# Patient Record
Sex: Male | Born: 1990
Health system: Southern US, Community
[De-identification: ages and names within clinical notes are randomized; demographics above are authoritative.]

## PROBLEM LIST (undated history)

## (undated) DIAGNOSIS — M1990 Unspecified osteoarthritis, unspecified site: Secondary | ICD-10-CM

## (undated) DIAGNOSIS — M2550 Pain in unspecified joint: Secondary | ICD-10-CM

## (undated) DIAGNOSIS — K760 Fatty (change of) liver, not elsewhere classified: Secondary | ICD-10-CM

## (undated) DIAGNOSIS — G5601 Carpal tunnel syndrome, right upper limb: Secondary | ICD-10-CM

## (undated) DIAGNOSIS — E559 Vitamin D deficiency, unspecified: Secondary | ICD-10-CM

## (undated) DIAGNOSIS — F419 Anxiety disorder, unspecified: Secondary | ICD-10-CM

## (undated) DIAGNOSIS — K219 Gastro-esophageal reflux disease without esophagitis: Secondary | ICD-10-CM

## (undated) DIAGNOSIS — N469 Male infertility, unspecified: Secondary | ICD-10-CM

## (undated) HISTORY — DX: Male infertility, unspecified: N46.9

## (undated) HISTORY — DX: Gastro-esophageal reflux disease without esophagitis: K21.9

## (undated) HISTORY — DX: Fatty (change of) liver, not elsewhere classified: K76.0

## (undated) HISTORY — DX: Vitamin D deficiency, unspecified: E55.9

## (undated) HISTORY — DX: Carpal tunnel syndrome, right upper limb: G56.01

## (undated) HISTORY — DX: Anxiety disorder, unspecified: F41.9

## (undated) HISTORY — DX: Unspecified osteoarthritis, unspecified site: M19.90

## (undated) HISTORY — DX: Pain in unspecified joint: M25.50

---

## 2013-03-29 ENCOUNTER — Encounter: Payer: Self-pay | Admitting: Sports Medicine

## 2013-03-29 ENCOUNTER — Ambulatory Visit (INDEPENDENT_AMBULATORY_CARE_PROVIDER_SITE_OTHER): Payer: BC Managed Care – PPO | Admitting: Sports Medicine

## 2013-03-29 VITALS — BP 130/82 | HR 74 | Wt 215.0 lb

## 2013-03-29 DIAGNOSIS — H6123 Impacted cerumen, bilateral: Secondary | ICD-10-CM

## 2013-03-29 DIAGNOSIS — M5412 Radiculopathy, cervical region: Secondary | ICD-10-CM

## 2013-03-29 DIAGNOSIS — Z0000 Encounter for general adult medical examination without abnormal findings: Secondary | ICD-10-CM | POA: Insufficient documentation

## 2013-03-29 DIAGNOSIS — Z299 Encounter for prophylactic measures, unspecified: Secondary | ICD-10-CM

## 2013-03-29 DIAGNOSIS — G5601 Carpal tunnel syndrome, right upper limb: Secondary | ICD-10-CM | POA: Insufficient documentation

## 2013-03-29 DIAGNOSIS — H6120 Impacted cerumen, unspecified ear: Secondary | ICD-10-CM

## 2013-03-29 HISTORY — DX: Carpal tunnel syndrome, right upper limb: G56.01

## 2013-03-29 MED ORDER — CYCLOBENZAPRINE HCL 10 MG PO TABS: ORAL_TABLET | ORAL | Status: DC

## 2013-03-29 MED ORDER — MELOXICAM 15 MG PO TABS: ORAL_TABLET | ORAL | Status: DC

## 2013-03-29 MED ORDER — PREDNISONE 50 MG PO TABS: ORAL_TABLET | ORAL | Status: DC

## 2013-03-29 NOTE — Assessment & Plan Note (Signed)
Irrigation bilateral.
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
## 2013-03-29 NOTE — Assessment & Plan Note (Signed)
Checking some routine fasting blood work.
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
## 2013-05-04 ENCOUNTER — Ambulatory Visit (INDEPENDENT_AMBULATORY_CARE_PROVIDER_SITE_OTHER): Payer: BC Managed Care – PPO | Admitting: Sports Medicine

## 2013-05-04 ENCOUNTER — Telehealth: Payer: Self-pay | Admitting: *Deleted

## 2013-05-04 VITALS — BP 124/72 | HR 49 | Wt 216.0 lb

## 2013-05-04 DIAGNOSIS — M5412 Radiculopathy, cervical region: Secondary | ICD-10-CM

## 2013-05-04 LAB — COMPREHENSIVE METABOLIC PANEL WITH GFR
ALT: 61 U/L — ABNORMAL HIGH (ref 0–53)
Albumin: 4.5 g/dL (ref 3.5–5.2)
CO2: 26 meq/L (ref 19–32)
Calcium: 9.8 mg/dL (ref 8.4–10.5)
Chloride: 104 meq/L (ref 96–112)
Glucose, Bld: 89 mg/dL (ref 70–99)
Potassium: 4.3 meq/L (ref 3.5–5.3)
Sodium: 138 meq/L (ref 135–145)
Total Bilirubin: 0.6 mg/dL (ref 0.3–1.2)
Total Protein: 7.1 g/dL (ref 6.0–8.3)

## 2013-05-04 LAB — CBC
HCT: 44 % (ref 39.0–52.0)
Hemoglobin: 15.1 g/dL (ref 13.0–17.0)
MCH: 31.3 pg (ref 26.0–34.0)
MCHC: 34.3 g/dL (ref 30.0–36.0)
MCV: 91.3 fL (ref 78.0–100.0)
Platelets: 348 K/uL (ref 150–400)
RBC: 4.82 MIL/uL (ref 4.22–5.81)
RDW: 13.1 % (ref 11.5–15.5)
WBC: 7.4 K/uL (ref 4.0–10.5)

## 2013-05-04 LAB — LIPID PANEL
Cholesterol: 143 mg/dL (ref 0–200)
HDL: 40 mg/dL (ref >39)
LDL Cholesterol: 82 mg/dL (ref 0–99)
Total CHOL/HDL Ratio: 3.6 Ratio
Triglycerides: 103 mg/dL (ref <150)
VLDL: 21 mg/dL (ref 0–40)

## 2013-05-04 LAB — COMPREHENSIVE METABOLIC PANEL
AST: 28 U/L (ref 0–37)
Alkaline Phosphatase: 72 U/L (ref 39–117)
BUN: 16 mg/dL (ref 6–23)
Creat: 0.85 mg/dL (ref 0.50–1.35)

## 2013-05-04 LAB — TSH: TSH: 1.177 u[IU]/mL (ref 0.350–4.500)

## 2013-05-04 NOTE — Assessment & Plan Note (Signed)
Persistent right-sided radiculopathy despite prednisone, Flexeril, Mobic, he was unable to do the home rehabilitation exercises, and declines formal physical therapy. At this point we are going to proceed with an MRI of the cervical spine for interventional injection planning. Return to see me to go over the results of the MRI.

## 2013-05-04 NOTE — Telephone Encounter (Signed)
PA obtained for MRI cervical spine w/o contrast. Myriam Jacobson at [facility] notified. PA # is [id number]

## 2013-05-04 NOTE — Progress Notes (Signed)
  Subjective:    CC: Followup  HPI: Right cervical radiculopathy:  This actually improved with prednisone, Flexeril, Mobic, he was able to sleep better. Currently he has no pain, but he still has numbness in his right hand. His neck occasionally locks, and when he does that is painful, the pain is mild, persistent. He is overall only slightly better since the last visit. He was unable to do any physical therapy.  Past medical history, Surgical history, Family history not pertinant except as noted below, Social history, Allergies, and medications have been entered into the medical record, reviewed, and no changes needed.   Review of Systems: No fevers, chills, night sweats, weight loss, chest pain, or shortness of breath.   Objective:    General: Well Developed, well nourished, and in no acute distress.  Neuro: Alert and oriented x3, extra-ocular muscles intact, sensation grossly intact.  HEENT: Normocephalic, atraumatic, pupils equal round reactive to light, neck supple, no masses, no lymphadenopathy, thyroid nonpalpable.  Skin: Warm and dry, no rashes. Cardiac: Regular rate and rhythm, no murmurs rubs or gallops, no lower extremity edema.  Respiratory: Clear to auscultation bilaterally. Not using accessory muscles, speaking in full sentences. Neck: Inspection unremarkable. No palpable stepoffs. Negative Spurling's maneuver. Full neck range of motion Grip strength and sensation normal in bilateral hands Strength good C4 to T1 distribution No sensory change to C4 to T1 Negative Hoffman sign bilaterally Reflexes normal Impression and Recommendations:

## 2013-05-05 ENCOUNTER — Other Ambulatory Visit: Payer: Self-pay | Admitting: Sports Medicine

## 2013-05-05 DIAGNOSIS — M5412 Radiculopathy, cervical region: Secondary | ICD-10-CM

## 2013-05-05 LAB — VITAMIN D 25 HYDROXY (VIT D DEFICIENCY, FRACTURES): Vit D, 25-Hydroxy: 44 ng/mL (ref 30–89)

## 2013-05-08 ENCOUNTER — Ambulatory Visit (HOSPITAL_BASED_OUTPATIENT_CLINIC_OR_DEPARTMENT_OTHER)
Admission: RE | Admit: 2013-05-08 | Discharge: 2013-05-08 | Disposition: A | Discharge status: Home / Self Care | Payer: BC Managed Care – PPO | Source: Ambulatory Visit | Attending: Sports Medicine | Admitting: Sports Medicine

## 2013-05-08 DIAGNOSIS — M5412 Radiculopathy, cervical region: Secondary | ICD-10-CM

## 2013-05-08 DIAGNOSIS — M542 Cervicalgia: Secondary | ICD-10-CM | POA: Insufficient documentation

## 2013-06-16 ENCOUNTER — Encounter: Payer: Self-pay | Admitting: Sports Medicine

## 2013-06-16 ENCOUNTER — Ambulatory Visit (INDEPENDENT_AMBULATORY_CARE_PROVIDER_SITE_OTHER): Payer: BC Managed Care – PPO | Admitting: Sports Medicine

## 2013-06-16 ENCOUNTER — Ambulatory Visit (INDEPENDENT_AMBULATORY_CARE_PROVIDER_SITE_OTHER): Payer: BC Managed Care – PPO

## 2013-06-16 VITALS — BP 126/76 | HR 74 | Wt 232.0 lb

## 2013-06-16 DIAGNOSIS — M222X2 Patellofemoral disorders, left knee: Secondary | ICD-10-CM | POA: Insufficient documentation

## 2013-06-16 DIAGNOSIS — M5412 Radiculopathy, cervical region: Secondary | ICD-10-CM

## 2013-06-16 DIAGNOSIS — M25569 Pain in unspecified knee: Secondary | ICD-10-CM

## 2013-06-16 MED ORDER — CYCLOBENZAPRINE HCL 10 MG PO TABS: ORAL_TABLET | ORAL | Status: DC

## 2013-06-16 MED ORDER — PREDNISONE (PAK) 10 MG PO TABS: ORAL_TABLET | ORAL | Status: DC

## 2013-06-16 NOTE — Progress Notes (Signed)
  Subjective:    CC: Followup  HPI: Neck pain and right arm numbness: Has been under some home exercises, steroids, Mobic, Flexeril, steroids and Flexeril helped significantly, but fortunately he had insufficient improvement and we obtained an MRI, the results of which will be dictated below. 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  Left knee pain: Larey Seat directly onto the knee approximately 7 years ago, had immediate pain, swelling but this was never evaluated by a physician. Since then his pain he localizes over the kneecap, associated with grinding, worse with going up and down stairs and sitting in a squatting position. It is moderate, persistent, local ice.  Past medical history, Surgical history, Family history not pertinant except as noted below, Social history, Allergies, and medications have been entered into the medical record, reviewed, and no changes needed.   Review of Systems: No fevers, chills, night sweats, weight loss, chest pain, or shortness of breath.   Objective:    General: Well Developed, well nourished, and in no acute distress.  Neuro: Alert and oriented x3, extra-ocular muscles intact, sensation grossly intact.  HEENT: Normocephalic, atraumatic, pupils equal round reactive to light, neck supple, no masses, no lymphadenopathy, thyroid nonpalpable.  Skin: Warm and dry, no rashes. Cardiac: Regular rate and rhythm, no murmurs rubs or gallops, no lower extremity edema.  Respiratory: Clear to auscultation bilaterally. Not using accessory muscles, speaking in full sentences. Neck: Inspection unremarkable. No palpable stepoffs. Negative Spurling's maneuver. Full neck range of motion Grip strength and sensation normal in bilateral hands Strength good C4 to T1 distribution No sensory change to C4 to T1 Negative Hoffman sign bilaterally Reflexes normal Left Knee: Normal to inspection with no erythema or  effusion or obvious bony abnormalities. Palpation normal with no warmth, joint line tenderness, patellar tenderness, or condyle tenderness. ROM full in flexion and extension and lower leg rotation. Ligaments with solid consistent endpoints including ACL, PCL, LCL, MCL. Negative Mcmurray's, Apley's, and Thessalonian tests. Painful patellar compression with crepitus. Patellar and quadriceps tendons unremarkable. Hamstring and quadriceps strength is normal.   X-rays of the knees were reviewed and were unremarkable.  Cervical spine MRI was reviewed, there is some reversal of the normal cervical lordosis, otherwise the discs, facet joints, spinal canal, and foramina were unremarkable.  Impression and Recommendations:

## 2013-06-16 NOTE — Assessment & Plan Note (Signed)
Pain is persistent with numbness in the right hand in a clinical C7 distribution but with a negative cervical spine MRI. He did have a good response to Flexeril and continues to have abnormal loss of cervical lordosis. At this point I'm going to give him an additional course of prednisone, 12 day taper. Refill Flexeril. He will do more home rehabilitation before we consider nerve conduction study. I would like to see him back in one month for this.

## 2013-06-16 NOTE — Assessment & Plan Note (Signed)
We will start conservatively with Mobic, home exercises. X-rays. Return in 4 weeks, consider injection if no better.

## 2013-07-15 ENCOUNTER — Ambulatory Visit: Payer: BC Managed Care – PPO | Admitting: Sports Medicine

## 2013-10-31 IMAGING — CR DG KNEE COMPLETE 4+V*L*
1 series · 1 of 1 positions shown · non-contrast
Comparison: None.

CLINICAL DATA: Knee pain

EXAM:
LEFT KNEE - COMPLETE 4+ VIEW

[view not recorded]
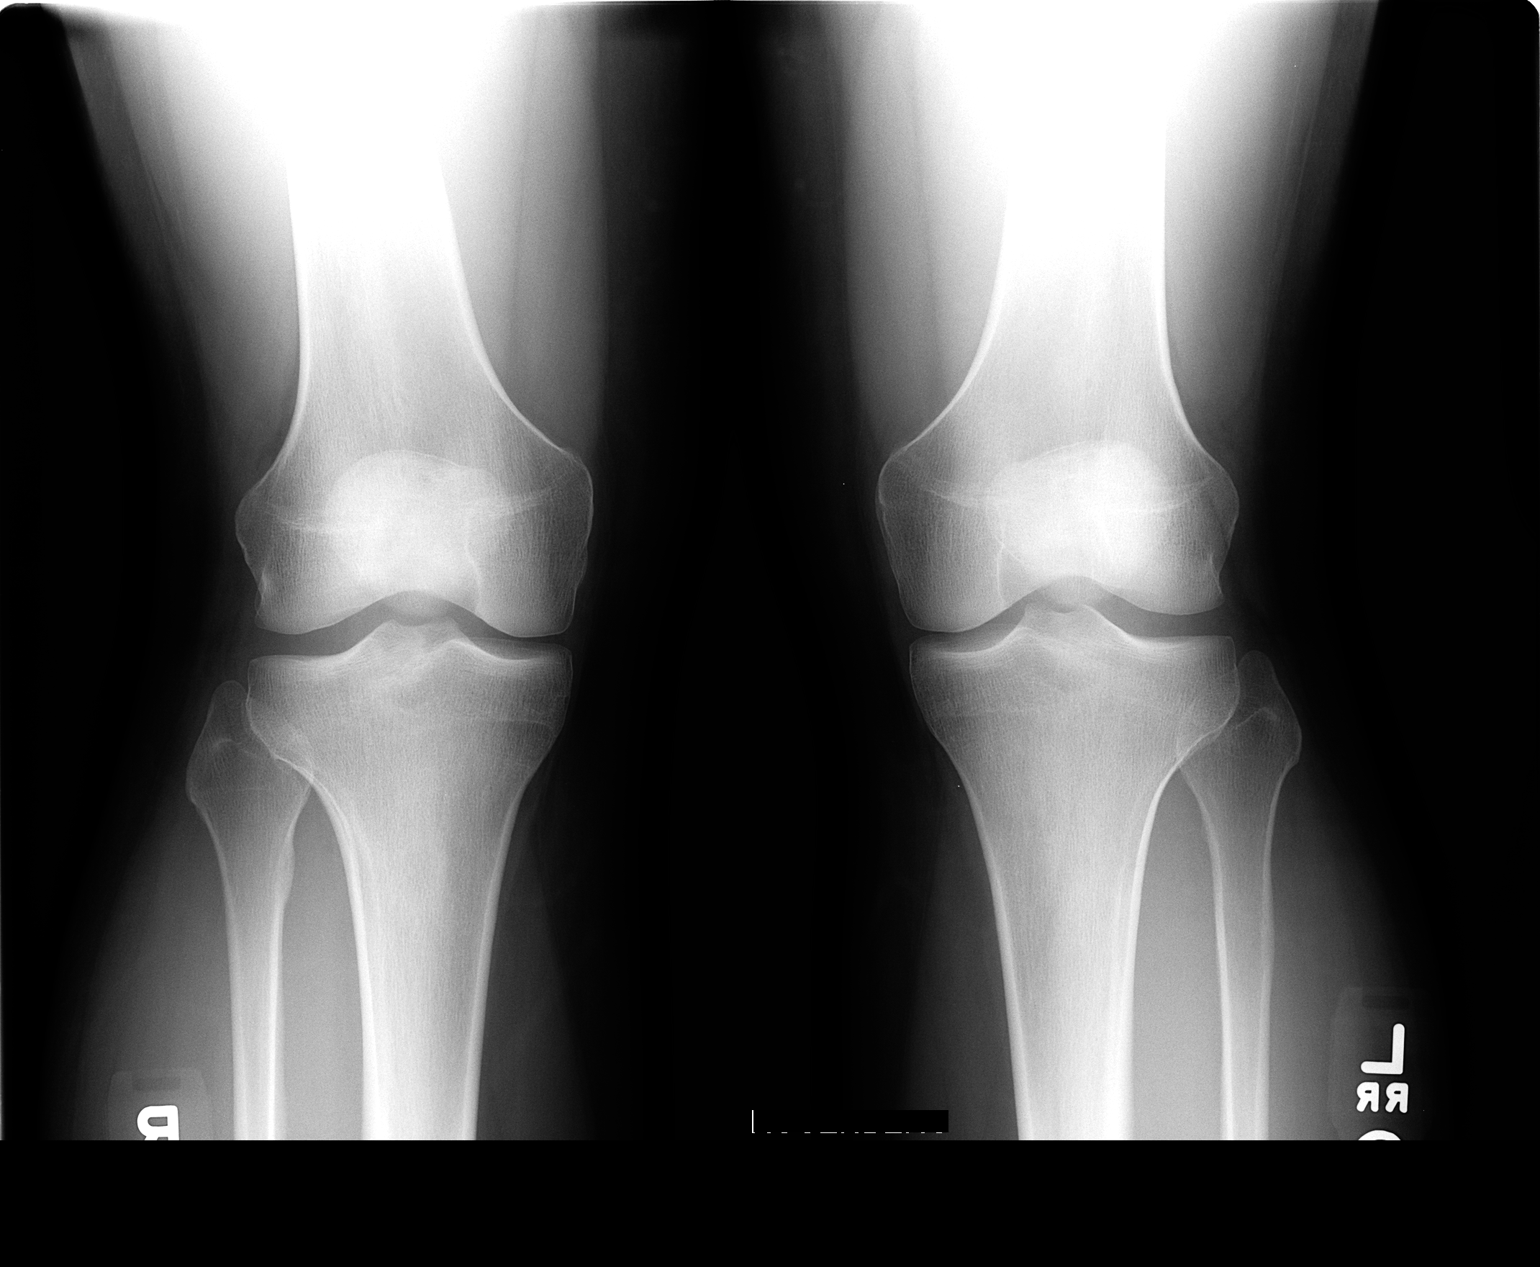

[1 of 1 positions shown; findings below may reference images not displayed]

FINDINGS: Standing views of both knees were obtained. The knee joint spaces
appear well preserved. No left knee joint effusion is seen. The left
patella is normally positioned within the patella femoral space.
IMPRESSION: Negative.

## 2013-12-29 ENCOUNTER — Encounter: Payer: Self-pay | Admitting: Sports Medicine

## 2013-12-29 ENCOUNTER — Ambulatory Visit (INDEPENDENT_AMBULATORY_CARE_PROVIDER_SITE_OTHER): Payer: BC Managed Care – PPO | Admitting: Sports Medicine

## 2013-12-29 VITALS — BP 127/83 | HR 55 | Ht 68.0 in | Wt 216.0 lb

## 2013-12-29 DIAGNOSIS — M5412 Radiculopathy, cervical region: Secondary | ICD-10-CM

## 2013-12-29 MED ORDER — TRAMADOL HCL 50 MG PO TABS: ORAL_TABLET | ORAL | Status: DC

## 2013-12-29 MED ORDER — MELOXICAM 15 MG PO TABS: ORAL_TABLET | ORAL | Status: DC

## 2013-12-29 NOTE — Progress Notes (Signed)
Patient ID: Adam Adams, male   DOB: 1991-05-19, 23 y.o.   MRN: [id number]  Subjective:    CC: continued neck pain and finger numbness  HPI:  Pt has an ongoing history of neck pain with numbness of the right 3rd and 4th fingers.  Most recently he was treated with prednisone and mobic with only minimal response.  He also experienced sleeplessness and agitation while taking the prednisone so he discontinued its use.  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  He fell down and was momentarily dizzy afterwards.  Since he has had worsened neck pain but no signs concerning for concussion.  His neck pain is still focal over his c-spine with no radiation down the arm.  His numbness is only present in the tips of his right 3rd and 4th fingers.    Past medical history, Surgical history, Family history not pertinant except as noted below, Social history, Allergies, and medications have been entered into the medical record, reviewed, and no changes needed.   Review of Systems: No fevers, chills, night sweats, weight loss, chest pain, or shortness of breath.   Objective:    General: Well Developed, well nourished, and in no acute distress.  Neuro: Alert and oriented x3, extra-ocular muscles intact, sensation grossly intact.  HEENT: Normocephalic, atraumatic, pupils equal round reactive to light, neck supple, no masses, no lymphadenopathy, thyroid nonpalpable.  Skin: Warm and dry, no rashes. Cardiac: Regular rate and rhythm, no murmurs rubs or gallops, no lower extremity edema.  Respiratory: Clear to auscultation bilaterally. Not using accessory muscles, speaking in full sentences. Neck: Inspection unremarkable. No palpable stepoffs. Negative Spurling's maneuver. Full neck range of motion Grip strength and sensation normal in bilateral hands Strength good C4 to T1 distribution Parasthesias in C7 distribution distally  Negative Hoffman  sign bilaterally Reflexes normal Right Wrist: Inspection normal with no visible erythema or swelling. ROM smooth and normal with good flexion and extension and ulnar/radial deviation that is symmetrical with opposite wrist. Palpation is normal over metacarpals, navicular, lunate, and TFCC; tendons without tenderness/ swelling No snuffbox tenderness. No tenderness over Canal of Guyon. Strength 5/5 in all directions without pain. Negative Finkelstein, tinel's and phalens. Negative Watson's test.  Impression and Recommendations:    This patient has evidence of cervical radiculopathy with distal C7 numbness and axial neck pain.  Recent MRI also showed evidence of multilevel DJD but no discs suspicious for his symptoms.  Given lack of response to conservative treatment and lack of definitive etiology of radicular symptoms, further evaluation with nerve conduction studies is warranted.   -Nerve conduction studies -Refill Mobic -Add tramadol

## 2013-12-29 NOTE — Assessment & Plan Note (Signed)
With numbness and tingling into the right third and fourth fingers, at this point I am unclear whether this is related to carpal tunnel syndrome versus cervical radiculopathy. We are going to obtain nerve conduction studies to further elucidate the site of compression. He does have multilevel mild degenerative changes in the C-spine with a congenitally narrow cervical spinal canal. I'm going to refill his NSAIDs, and add tramadol for breakthrough pain. Return to see me for the results of the nerve conduction study.

## 2014-01-04 ENCOUNTER — Ambulatory Visit: Payer: BC Managed Care – PPO | Admitting: Sports Medicine

## 2014-01-05 ENCOUNTER — Ambulatory Visit: Payer: BC Managed Care – PPO | Admitting: Sports Medicine

## 2014-01-06 ENCOUNTER — Encounter (INDEPENDENT_AMBULATORY_CARE_PROVIDER_SITE_OTHER): Payer: Self-pay | Admitting: Neurology

## 2014-01-06 ENCOUNTER — Ambulatory Visit (INDEPENDENT_AMBULATORY_CARE_PROVIDER_SITE_OTHER): Payer: BC Managed Care – HMO

## 2014-01-06 ENCOUNTER — Encounter: Payer: Self-pay | Admitting: Sports Medicine

## 2014-01-06 DIAGNOSIS — R209 Unspecified disturbances of skin sensation: Secondary | ICD-10-CM

## 2014-01-06 DIAGNOSIS — G5600 Carpal tunnel syndrome, unspecified upper limb: Secondary | ICD-10-CM

## 2014-01-06 DIAGNOSIS — Z0289 Encounter for other administrative examinations: Secondary | ICD-10-CM

## 2014-01-06 NOTE — Procedures (Signed)
     HISTORY:  Adam Adams is a 23 year old Hispanic male with a one-year history of right hand numbness and fatigue affecting mainly the ulnar aspect of the right hand. He also reports neck discomfort and pain down the right arm. He is being evaluated for possible neuropathy or a cervical radiculopathy.  NERVE CONDUCTION STUDIES:  Nerve conduction studies were performed on both upper extremities. The distal motor latencies for the median nerves were prolonged on the right, normal on the left, with normal motor amplitudes for these nerves bilaterally. The F wave latencies and nerve conduction velocities for the median nerves were normal bilaterally. The sensory latencies for the median nerves were prolonged on the right, normal on the left, and normal for the ulnar nerves bilaterally. The distal motor latencies and motor amplitudes for the ulnar nerves were normal, with normal F wave latencies and nerve conduction velocities bilaterally.  EMG STUDIES:  EMG study was performed on the right upper extremity:  The first dorsal interosseous muscle reveals 2 to 4 K units with full recruitment. No fibrillations or positive waves were noted. The abductor pollicis brevis muscle reveals 2 to 4 K units with full recruitment. No fibrillations or positive waves were noted. The extensor indicis proprius muscle reveals 1 to 3 K units with full recruitment. No fibrillations or positive waves were noted. The pronator teres muscle reveals 2 to 3 K units with full recruitment. No fibrillations or positive waves were noted. The biceps muscle reveals 1 to 2 K units with full recruitment. No fibrillations or positive waves were noted. The triceps muscle reveals 2 to 4 K units with full recruitment. No fibrillations or positive waves were noted. The anterior deltoid muscle reveals 2 to 3 K units with full recruitment. No fibrillations or positive waves were noted. The cervical paraspinal muscles were tested at 2  levels. No abnormalities of insertional activity were seen at either level tested. There was good relaxation.   IMPRESSION:  Nerve conduction studies done on both upper extremities shows evidence of a mild right carpal tunnel syndrome. EMG evaluation of the right upper extremity was unremarkable, without evidence of an overlying cervical radiculopathy.  Adam Adams. Adam Annahi Short MD 01/06/2014 12:35 PM  [facility] [address] [place], [place] [id number]  Phone [phone] Fax [phone]

## 2014-05-31 ENCOUNTER — Ambulatory Visit (INDEPENDENT_AMBULATORY_CARE_PROVIDER_SITE_OTHER): Payer: BC Managed Care – PPO | Admitting: Sports Medicine

## 2014-05-31 ENCOUNTER — Encounter: Payer: Self-pay | Admitting: Sports Medicine

## 2014-05-31 ENCOUNTER — Ambulatory Visit: Payer: BC Managed Care – PPO | Admitting: Sports Medicine

## 2014-05-31 VITALS — BP 127/82 | HR 68 | Ht 68.0 in | Wt 223.0 lb

## 2014-05-31 DIAGNOSIS — R740 Nonspecific elevation of levels of transaminase and lactic acid dehydrogenase [LDH]: Secondary | ICD-10-CM

## 2014-05-31 DIAGNOSIS — R202 Paresthesia of skin: Principal | ICD-10-CM

## 2014-05-31 DIAGNOSIS — R200 Anesthesia of skin: Secondary | ICD-10-CM

## 2014-05-31 DIAGNOSIS — R7401 Elevation of levels of liver transaminase levels: Secondary | ICD-10-CM

## 2014-05-31 DIAGNOSIS — Z299 Encounter for prophylactic measures, unspecified: Secondary | ICD-10-CM

## 2014-05-31 DIAGNOSIS — G5600 Carpal tunnel syndrome, unspecified upper limb: Secondary | ICD-10-CM | POA: Diagnosis not present

## 2014-05-31 NOTE — Assessment & Plan Note (Signed)
Median nerve hydrodissection as above. Daily splinting. Return in one month. Nerve conduction study did confirm right-sided carpal tunnel syndrome without evidence of cervical radiculitis.

## 2014-05-31 NOTE — Assessment & Plan Note (Signed)
Checking routine blood work, patient will return fasting in the morning.

## 2014-05-31 NOTE — Progress Notes (Signed)
  Subjective:    CC: Followup  HPI: This is a very pleasant 23 year old male, he has been having some right hand numbness and tingling, initially diagnosed in with possible cervical radicular symptoms but he did not improve with conservative measures. Subsequently there was concern concerned as to whether this represented a carpal tunnel syndrome so we obtain nerve conduction study. This was approximately 4 months ago. Nerve conduction studies did show right-sided carpal tunnel syndrome with no evidence of cervical radiculopathy. He is here for further evaluation treatment, symptoms are moderate, persistent.  Past medical history, Surgical history, Family history not pertinant except as noted below, Social history, Allergies, and medications have been entered into the medical record, reviewed, and no changes needed.   Review of Systems: No fevers, chills, night sweats, weight loss, chest pain, or shortness of breath.   Objective:    General: Well Developed, well nourished, and in no acute distress.  Neuro: Alert and oriented x3, extra-ocular muscles intact, sensation grossly intact.  HEENT: Normocephalic, atraumatic, pupils equal round reactive to light, neck supple, no masses, no lymphadenopathy, thyroid nonpalpable.  Skin: Warm and dry, no rashes. Cardiac: Regular rate and rhythm, no murmurs rubs or gallops, no lower extremity edema.  Respiratory: Clear to auscultation bilaterally. Not using accessory muscles, speaking in full sentences.  Procedure: Real-time Ultrasound Guided hydrodissection of right median nerve Device: GE Logiq E  Verbal informed consent obtained.  Time-out conducted.  Noted no overlying erythema, induration, or other signs of local infection.  Skin prepped in a sterile fashion.  Local anesthesia: Topical Ethyl chloride.  With sterile technique and under real time ultrasound guidance: A total of 1 cc kenalog 40, 4 cc lidocaine injected both superficial to and deep to  the median nerve in the carpal tunnel free from surrounding structures.  Completed without difficulty  Pain immediately resolved suggesting accurate placement of the medication.  Advised to call if fevers/chills, erythema, induration, drainage, or persistent bleeding.  Images permanently stored and available for review in the ultrasound unit.  Impression: Technically successful ultrasound guided injection.  Impression and Recommendations:

## 2014-06-01 LAB — CBC
HCT: 43.4 % (ref 39.0–52.0)
Hemoglobin: 15.5 g/dL (ref 13.0–17.0)
MCH: 31.7 pg (ref 26.0–34.0)
MCHC: 35.7 g/dL (ref 30.0–36.0)
MCV: 88.8 fL (ref 78.0–100.0)
Platelets: 339 10*3/uL (ref 150–400)
RBC: 4.89 MIL/uL (ref 4.22–5.81)
RDW: 13.8 % (ref 11.5–15.5)
WBC: 10.2 10*3/uL (ref 4.0–10.5)

## 2014-06-01 LAB — COMPREHENSIVE METABOLIC PANEL WITH GFR
ALT: 145 U/L — ABNORMAL HIGH (ref 0–53)
CO2: 23 meq/L (ref 19–32)
Calcium: 9.6 mg/dL (ref 8.4–10.5)
Glucose, Bld: 87 mg/dL (ref 70–99)
Potassium: 4.2 meq/L (ref 3.5–5.3)
Sodium: 139 meq/L (ref 135–145)
Total Bilirubin: 0.7 mg/dL (ref 0.2–1.2)

## 2014-06-01 LAB — COMPREHENSIVE METABOLIC PANEL
AST: 50 U/L — ABNORMAL HIGH (ref 0–37)
Albumin: 4.5 g/dL (ref 3.5–5.2)
Alkaline Phosphatase: 61 U/L (ref 39–117)
BUN: 13 mg/dL (ref 6–23)
Chloride: 104 mEq/L (ref 96–112)
Creat: 0.75 mg/dL (ref 0.50–1.35)
Total Protein: 7.3 g/dL (ref 6.0–8.3)

## 2014-06-01 LAB — LIPID PANEL
Cholesterol: 164 mg/dL (ref 0–200)
HDL: 53 mg/dL (ref >39)
LDL Cholesterol: 95 mg/dL (ref 0–99)
Total CHOL/HDL Ratio: 3.1 Ratio
Triglycerides: 80 mg/dL (ref <150)
VLDL: 16 mg/dL (ref 0–40)

## 2014-06-01 LAB — TSH: TSH: 1 u[IU]/mL (ref 0.350–4.500)

## 2014-06-02 DIAGNOSIS — R7401 Elevation of levels of liver transaminase levels: Secondary | ICD-10-CM | POA: Insufficient documentation

## 2014-06-02 DIAGNOSIS — R740 Nonspecific elevation of levels of transaminase and lactic acid dehydrogenase [LDH]: Secondary | ICD-10-CM

## 2014-06-02 LAB — HEMOGLOBIN A1C
Hgb A1c MFr Bld: 5.8 % — ABNORMAL HIGH (ref <5.7)
Mean Plasma Glucose: 120 mg/dL — ABNORMAL HIGH (ref <117)

## 2014-06-02 LAB — VITAMIN D 25 HYDROXY (VIT D DEFICIENCY, FRACTURES): Vit D, 25-Hydroxy: 28 ng/mL — ABNORMAL LOW (ref 30–89)

## 2014-06-02 NOTE — Addendum Note (Signed)
Addended by: Monica BectonHEKKEKANDAM, THOMAS J on: 06/02/2014 09:16 AM   Modules accepted: Orders

## 2015-03-07 ENCOUNTER — Encounter: Payer: Self-pay | Admitting: Sports Medicine

## 2015-03-07 ENCOUNTER — Ambulatory Visit (INDEPENDENT_AMBULATORY_CARE_PROVIDER_SITE_OTHER): Payer: BLUE CROSS/BLUE SHIELD | Admitting: Sports Medicine

## 2015-03-07 VITALS — BP 118/71 | HR 81 | Wt 222.0 lb

## 2015-03-07 DIAGNOSIS — G5601 Carpal tunnel syndrome, right upper limb: Secondary | ICD-10-CM

## 2015-03-07 DIAGNOSIS — L03115 Cellulitis of right lower limb: Secondary | ICD-10-CM | POA: Insufficient documentation

## 2015-03-07 DIAGNOSIS — M67441 Ganglion, right hand: Secondary | ICD-10-CM | POA: Diagnosis not present

## 2015-03-07 MED ORDER — DOXYCYCLINE HYCLATE 100 MG PO TABS: 100.0000 mg | ORAL_TABLET | Freq: Two times a day (BID) | ORAL | Status: AC

## 2015-03-07 NOTE — Progress Notes (Addendum)
  Subjective:    CC: Follow-up and knot on finger  HPI: This is a pleasant 24 year old male, he is a Curatormechanic, over the past several weeks he's noted a minimally painful lump at the right third metacarpophalangeal joint from a volar aspect, no trauma. No triggering of the finger, pain is mild, persistent.  Right carpal tunnel syndrome: Nerve conduction study confirmed, he did extremely well with a median nerve hydrodissection, with a six-month response.  Past medical history, Surgical history, Family history not pertinant except as noted below, Social history, Allergies, and medications have been entered into the medical record, reviewed, and no changes needed.   Review of Systems: No fevers, chills, night sweats, weight loss, chest pain, or shortness of breath.   Objective:    General: Well Developed, well nourished, and in no acute distress.  Neuro: Alert and oriented x3, extra-ocular muscles intact, sensation grossly intact.  HEENT: Normocephalic, atraumatic, pupils equal round reactive to light, neck supple, no masses, no lymphadenopathy, thyroid nonpalpable.  Skin: Warm and dry, no rashes. Cardiac: Regular rate and rhythm, no murmurs rubs or gallops, no lower extremity edema.  Respiratory: Clear to auscultation bilaterally. Not using accessory muscles, speaking in full sentences. Right hand: There is a 1 cm palpable, well-defined, movable nodule at the right third metacarpophalangeal joint, volar aspect, it does not move with movement of the digit. It is minimally tender.  Procedure: Real-time Ultrasound Guided Injection of right third flexor sheath ganglion cyst Device: GE Logiq E  Verbal informed consent obtained.  Time-out conducted.  Noted no overlying erythema, induration, or other signs of local infection.  Skin prepped in a sterile fashion.  Local anesthesia: Topical Ethyl chloride.  With sterile technique and under real time ultrasound guidance:  Injected 0.5 mL kenalog 40,  0.5 mL lidocaine, and a fenestrated ganglion cyst multiple times, it did collapse completely. Completed without difficulty  Pain immediately resolved suggesting accurate placement of the medication.  Advised to call if fevers/chills, erythema, induration, drainage, or persistent bleeding.  Images permanently stored and available for review in the ultrasound unit.  Impression: Technically successful ultrasound guided injection.  Impression and Recommendations:    I spent 40 minutes with this patient, greater than 50% was face-to-face time counseling regarding the above diagnoses

## 2015-03-07 NOTE — Assessment & Plan Note (Signed)
Percutaneous fenestration and injection as above. Return in one month.

## 2015-03-07 NOTE — Assessment & Plan Note (Signed)
6 month response to median nerve hydrodissection of the right side. Patient was very happy with results and will return for a repeat.

## 2015-03-30 ENCOUNTER — Encounter: Payer: Self-pay | Admitting: Sports Medicine

## 2015-03-30 ENCOUNTER — Ambulatory Visit (INDEPENDENT_AMBULATORY_CARE_PROVIDER_SITE_OTHER): Payer: BLUE CROSS/BLUE SHIELD | Admitting: Sports Medicine

## 2015-03-30 VITALS — BP 123/88 | HR 62 | Temp 98.4°F | Wt 220.0 lb

## 2015-03-30 DIAGNOSIS — N469 Male infertility, unspecified: Secondary | ICD-10-CM

## 2015-03-30 DIAGNOSIS — M67441 Ganglion, right hand: Secondary | ICD-10-CM | POA: Diagnosis not present

## 2015-03-30 DIAGNOSIS — G5601 Carpal tunnel syndrome, right upper limb: Secondary | ICD-10-CM

## 2015-03-30 DIAGNOSIS — Z3009 Encounter for other general counseling and advice on contraception: Secondary | ICD-10-CM

## 2015-03-30 HISTORY — DX: Male infertility, unspecified: N46.9

## 2015-03-30 NOTE — Assessment & Plan Note (Signed)
Has been using the rhythm method, and a fertility at on the phone, trying to have a baby with his wife. They have been trying for 4 months, he has no children and needed as his wife. We discussed the rhythm method, he will try for an additional 8 months before we consider semen analysis.

## 2015-03-30 NOTE — Assessment & Plan Note (Signed)
Seven-month response to previous right carpal tunnel hydrodissection, repeat today.

## 2015-03-30 NOTE — Assessment & Plan Note (Signed)
Continues to be resolved after injection and percutaneous fenestration at the last visit.

## 2015-03-30 NOTE — Progress Notes (Signed)
  Subjective:    CC: Follow-up  HPI: Ganglion cyst: Resolved after percutaneous fenestration  Carpal tunnel syndrome: 7 month response to previous ultrasound-guided hydrodissection, right side. Desires a repeat. Symptoms are moderate, persistent without radiation. He does have EMG and nerve conduction confirmed carpal tunnel syndrome.  Family planning: Has been trying for 4 months using the rhythm method, he has no children, his wife has no children. Amenable to try for an additional 8 months before we consider this in fertility. His wife does have a fertility specialist currently.  Past medical history, Surgical history, Family history not pertinant except as noted below, Social history, Allergies, and medications have been entered into the medical record, reviewed, and no changes needed.   Review of Systems: No fevers, chills, night sweats, weight loss, chest pain, or shortness of breath.   Objective:    General: Well Developed, well nourished, and in no acute distress.  Neuro: Alert and oriented x3, extra-ocular muscles intact, sensation grossly intact.  HEENT: Normocephalic, atraumatic, pupils equal round reactive to light, neck supple, no masses, no lymphadenopathy, thyroid nonpalpable.  Skin: Warm and dry, no rashes. Cardiac: Regular rate and rhythm, no murmurs rubs or gallops, no lower extremity edema.  Respiratory: Clear to auscultation bilaterally. Not using accessory muscles, speaking in full sentences.  Procedure: Real-time Ultrasound Guided hydrodissection of the right median nerve in the tunnel. Device: GE Logiq E  Verbal informed consent obtained.  Time-out conducted.  Noted no overlying erythema, induration, or other signs of local infection.  Skin prepped in a sterile fashion.  Local anesthesia: Topical Ethyl chloride.  With sterile technique and under real time ultrasound guidance:  Using a 25-gauge needle I injected medication both superficial to and deep to the  median nerve in the carpal tunnel freeing it from surrounding structures, total of 1 mL kenalog 40, 5 mL lidocaine was used. Completed without difficulty  Pain immediately resolved suggesting accurate placement of the medication.  Advised to call if fevers/chills, erythema, induration, drainage, or persistent bleeding.  Images permanently stored and available for review in the ultrasound unit.  Impression: Technically successful ultrasound guided injection.  Impression and Recommendations:

## 2015-11-24 ENCOUNTER — Ambulatory Visit (INDEPENDENT_AMBULATORY_CARE_PROVIDER_SITE_OTHER): Payer: BLUE CROSS/BLUE SHIELD | Admitting: Sports Medicine

## 2015-11-24 ENCOUNTER — Encounter: Payer: Self-pay | Admitting: Sports Medicine

## 2015-11-24 VITALS — BP 123/87 | HR 60 | Resp 18 | Wt 217.5 lb

## 2015-11-24 DIAGNOSIS — G5601 Carpal tunnel syndrome, right upper limb: Secondary | ICD-10-CM

## 2015-11-24 DIAGNOSIS — M67441 Ganglion, right hand: Secondary | ICD-10-CM | POA: Diagnosis not present

## 2015-11-24 DIAGNOSIS — N469 Male infertility, unspecified: Secondary | ICD-10-CM | POA: Diagnosis not present

## 2015-11-24 LAB — LUTEINIZING HORMONE: LH: 2.5 m[IU]/mL (ref 1.5–9.3)

## 2015-11-24 LAB — PROLACTIN: Prolactin: 5.2 ng/mL (ref 2.0–18.0)

## 2015-11-24 LAB — FOLLICLE STIMULATING HORMONE: FSH: 3.4 m[IU]/mL (ref 1.6–8.0)

## 2015-11-24 LAB — TSH: TSH: 0.75 mIU/L (ref 0.40–4.50)

## 2015-11-24 NOTE — Assessment & Plan Note (Signed)
Checking blood work as well as referral to [facility] for semen analysis.

## 2015-11-24 NOTE — Assessment & Plan Note (Signed)
Additionally eight-month response, repeat right median nerve hydrodissection

## 2015-11-24 NOTE — Progress Notes (Signed)
  Subjective:    CC:  Follow-up  HPI: Ganglion cyst: eight-month response to  Prior right third flexor tendon sheath fenestration of ganglion cyst. Desires repeat.  Male infertility: Has now been trying using the rhythm method with his wife for over one year, neither of them have any children.  Carpal tunnel syndrome: Desires repeat median nerve hydrodssection, eight-month response to the prior procedure.  Past medical history, Surgical history, Family history not pertinant except as noted below, Social history, Allergies, and medications have been entered into the medical record, reviewed, and no changes needed.   Review of Systems: No fevers, chills, night sweats, weight loss, chest pain, or shortness of breath.   Objective:    General: Well Developed, well nourished, and in no acute distress.  Neuro: Alert and oriented x3, extra-ocular muscles intact, sensation grossly intact.  HEENT: Normocephalic, atraumatic, pupils equal round reactive to light, neck supple, no masses, no lymphadenopathy, thyroid nonpalpable.  Skin: Warm and dry, no rashes. Cardiac: Regular rate and rhythm, no murmurs rubs or gallops, no lower extremity edema.  Respiratory: Clear to auscultation bilaterally. Not using accessory muscles, speaking in full sentences.  Procedure: Real-time Ultrasound Guided Injection of right third flexor tendon sheath ganglion cyst Device: GE Logiq E  Verbal informed consent obtained.  Time-out conducted.  Noted no overlying erythema, induration, or other signs of local infection.  Skin prepped in a sterile fashion.  Local anesthesia: Topical Ethyl chloride.  With sterile technique and under real time ultrasound guidance:  Injected and fenestrated with 30-gauge needle and 0.5 mL kenalog 40, 0.5 mL lidocaine. Completed without difficulty  Pain immediately resolved suggesting accurate placement of the medication.  Advised to call if fevers/chills, erythema, induration, drainage, or  persistent bleeding.  Images permanently stored and available for review in the ultrasound unit.  Impression: Technically successful ultrasound guided injection.  Procedure: Real-time Ultrasound Guided Injection of Right median nerve hydrodissection Device: GE Logiq E  Verbal informed consent obtained.  Time-out conducted.  Noted no overlying erythema, induration, or other signs of local infection.  Skin prepped in a sterile fashion.  Local anesthesia: Topical Ethyl chloride.  With sterile technique and under real time ultrasound guidance:  Injected total of 1 mL kenalog 40, 5 mL lidocaine both superficial to and deep to the median nerve in the carpal tunnel. Completed without difficulty  Pain immediately resolved suggesting accurate placement of the medication.  Advised to call if fevers/chills, erythema, induration, drainage, or persistent bleeding.  Images permanently stored and available for review in the ultrasound unit.  Impression: Technically successful ultrasound guided injection.  Impression and Recommendations:

## 2015-11-24 NOTE — Assessment & Plan Note (Signed)
Eight-month response, repeat fenestration and injection.

## 2015-11-27 LAB — TESTOSTERONE, FREE AND TOTAL (INCLUDES SHBG)-(MALES)
Sex Hormone Binding: 16 nmol/L (ref 10–50)
Testosterone, Free: 74.1 pg/mL (ref 47.0–244.0)
Testosterone-% Free: 2.8 % (ref 1.6–2.9)
Testosterone: 268 ng/dL (ref 250–827)

## 2015-12-05 DIAGNOSIS — Z3141 Encounter for fertility testing: Secondary | ICD-10-CM | POA: Diagnosis not present

## 2015-12-27 ENCOUNTER — Telehealth: Payer: Self-pay

## 2015-12-27 NOTE — Telephone Encounter (Signed)
Patient advised of results.

## 2015-12-27 NOTE — Telephone Encounter (Signed)
Adam Adams is calling for the results to his semen results. Please advise.

## 2015-12-27 NOTE — Telephone Encounter (Signed)
Semen analysis was completely normal.

## 2016-01-16 DIAGNOSIS — H15102 Unspecified episcleritis, left eye: Secondary | ICD-10-CM | POA: Diagnosis not present

## 2016-08-09 DIAGNOSIS — Z3189 Encounter for other procreative management: Secondary | ICD-10-CM | POA: Diagnosis not present

## 2016-10-04 ENCOUNTER — Ambulatory Visit (INDEPENDENT_AMBULATORY_CARE_PROVIDER_SITE_OTHER): Payer: BLUE CROSS/BLUE SHIELD | Admitting: Sports Medicine

## 2016-10-04 DIAGNOSIS — R69 Illness, unspecified: Secondary | ICD-10-CM

## 2016-10-04 DIAGNOSIS — G5601 Carpal tunnel syndrome, right upper limb: Secondary | ICD-10-CM

## 2016-10-04 DIAGNOSIS — J111 Influenza due to unidentified influenza virus with other respiratory manifestations: Secondary | ICD-10-CM | POA: Insufficient documentation

## 2016-10-04 MED ORDER — OSELTAMIVIR PHOSPHATE 75 MG PO CAPS: 75.0000 mg | ORAL_CAPSULE | Freq: Two times a day (BID) | ORAL | 0 refills | Status: DC

## 2016-10-04 NOTE — Assessment & Plan Note (Signed)
8 month response to previous median nerve hydrodissection, repeat median nerve hydrodissection today, return as needed.

## 2016-10-04 NOTE — Assessment & Plan Note (Signed)
Tamiflu, over-the-counter cold medications, return as needed.

## 2016-10-04 NOTE — Addendum Note (Signed)
Addended by: Monica BectonHEKKEKANDAM, THOMAS J on: 10/04/2016 03:24 PM   Modules accepted: Orders

## 2016-10-04 NOTE — Progress Notes (Signed)
  Subjective:    CC: Right wrist pain  HPI: This is a pleasant 26 year old male with nerve conduction study confirmed right carpal tunnel syndrome. We did a right median nerve hydrodissection about 11 months ago, he did extremely well with 90% relief of all symptoms for at least 8-9 months. He has a recurrence of symptoms and desires repeat median nerve hydrodissection today. Pain is moderate, persistent, localized radiation into the hand, as well as paresthesias.  Past medical history:  Negative.  See flowsheet/record as well for more information.  Surgical history: Negative.  See flowsheet/record as well for more information.  Family history: Negative.  See flowsheet/record as well for more information.  Social history: Negative.  See flowsheet/record as well for more information.  Allergies, and medications have been entered into the medical record, reviewed, and no changes needed.   Review of Systems: No fevers, chills, night sweats, weight loss, chest pain, or shortness of breath.   Objective:    General: Well Developed, well nourished, and in no acute distress.  Neuro: Alert and oriented x3, extra-ocular muscles intact, sensation grossly intact.  HEENT: Normocephalic, atraumatic, pupils equal round reactive to light, neck supple, no masses, no lymphadenopathy, thyroid nonpalpable.  Skin: Warm and dry, no rashes. Cardiac: Regular rate and rhythm, no murmurs rubs or gallops, no lower extremity edema.  Respiratory: Clear to auscultation bilaterally. Not using accessory muscles, speaking in full sentences.  Procedure: Real-time Ultrasound Guided Injection of right median nerve hydrodissection Device: GE Logiq E  Verbal informed consent obtained.  Time-out conducted.  Noted no overlying erythema, induration, or other signs of local infection.  Skin prepped in a sterile fashion.  Local anesthesia: Topical Ethyl chloride.  With sterile technique and under real time ultrasound guidance:   Using a 25-gauge needle injected medication both superficial to and deep to the median nerve in the carpal tunnel free from a running structures, I then redirected deep to the carpal tunnel and injected total of 1 mL kenalog 40, 5 mL lidocaine around the flexor tendons. Completed without difficulty  Pain immediately resolved suggesting accurate placement of the medication.  Advised to call if fevers/chills, erythema, induration, drainage, or persistent bleeding.  Images permanently stored and available for review in the ultrasound unit.  Impression: Technically successful ultrasound guided injection.  Impression and Recommendations:    Right carpal tunnel syndrome 8 month response to previous median nerve hydrodissection, repeat median nerve hydrodissection today, return as needed.

## 2016-11-05 ENCOUNTER — Ambulatory Visit: Payer: BLUE CROSS/BLUE SHIELD | Admitting: Sports Medicine

## 2016-11-07 ENCOUNTER — Other Ambulatory Visit: Payer: Self-pay

## 2016-11-07 ENCOUNTER — Emergency Department (INDEPENDENT_AMBULATORY_CARE_PROVIDER_SITE_OTHER): Payer: BLUE CROSS/BLUE SHIELD

## 2016-11-07 ENCOUNTER — Encounter: Payer: Self-pay | Admitting: Emergency Medicine

## 2016-11-07 ENCOUNTER — Emergency Department
Admission: EM | Admit: 2016-11-07 | Discharge: 2016-11-07 | Disposition: A | Discharge status: Home / Self Care | Payer: BLUE CROSS/BLUE SHIELD | Source: Home / Self Care | Attending: Family Medicine | Admitting: Family Medicine

## 2016-11-07 DIAGNOSIS — R079 Chest pain, unspecified: Secondary | ICD-10-CM

## 2016-11-07 DIAGNOSIS — M940 Chondrocostal junction syndrome [Tietze]: Secondary | ICD-10-CM

## 2016-11-07 MED ORDER — MELOXICAM 15 MG PO TABS: 15.0000 mg | ORAL_TABLET | Freq: Every day | ORAL | 0 refills | Status: DC

## 2016-11-07 MED ORDER — HYDROCODONE-ACETAMINOPHEN 5-325 MG PO TABS: ORAL_TABLET | ORAL | 0 refills | Status: DC

## 2016-11-07 NOTE — ED Triage Notes (Signed)
Chest pain started today

## 2016-11-07 NOTE — ED Provider Notes (Signed)
[facility]    CSN: [id number] Arrival date & time: 11/07/16  2007     History   Chief Complaint Chief Complaint  Patient presents with  . Chest Pain    HPI Adam Adams is a 26 y.o. male.   Patient developed non-radiating pain in his sternum area today about noon.  The pain has persisted, and is worse with movement and inspiration.  He recalls no injury. He states that he developed a random cough three days ago, now more frequent.  No fevers, chills, and sweats.  No shortness of breath.   The history is provided by the patient.    History reviewed. No pertinent past medical history.  Patient Active Problem List   Diagnosis Date Noted  . Influenza-like illness 10/04/2016  . Male infertility 03/30/2015  . Ganglion cyst of flexor tendon sheath of finger of right hand 03/07/2015  . Transaminitis 06/02/2014  . Preventive measure 03/29/2013  . Right carpal tunnel syndrome 03/29/2013    History reviewed. No pertinent surgical history.     Home Medications    Prior to Admission medications   Medication Sig Start Date End Date Taking? Authorizing Provider  meloxicam (MOBIC) 15 MG tablet Take 1 tablet (15 mg total) by mouth daily. Take with food each morning 11/07/16   Adam HawStephen A Hiliana Eilts, MD    Family History No family history on file.  Social History Social History  Substance Use Topics  . Smoking status: Never Smoker  . Smokeless tobacco: Never Used  . Alcohol use Not on file     Allergies   Patient has no known allergies.   Review of Systems Review of Systems No sore throat + cough ? pleuritic pain No wheezing No nasal congestion No post-nasal drainage No sinus pain/pressure No itchy/red eyes No earache No hemoptysis No SOB No fever/chills No nausea No vomiting No abdominal pain No diarrhea No urinary symptoms No skin rash + fatigue No myalgias No headache Used OTC meds without relief   Physical Exam Triage Vital Signs ED  Triage Vitals  Enc Vitals Group     BP 11/07/16 2023 134/90     Pulse Rate 11/07/16 2023 74     Resp --      Temp 11/07/16 2023 98.3 F (36.8 C)     Temp Source 11/07/16 2023 Oral     SpO2 11/07/16 2023 100 %     Weight 11/07/16 2024 200 lb (90.7 kg)     Height 11/07/16 2024 5\' 8"  (1.727 m)     Head Circumference --      Peak Flow --      Pain Score 11/07/16 2025 5     Pain Loc --      Pain Edu? --      Excl. in GC? --    No data found.   Updated Vital Signs BP 134/90 (BP Location: Left Arm)   Pulse 74   Temp 98.3 F (36.8 C) (Oral)   Ht 5\' 8"  (1.727 m)   Wt 200 lb (90.7 kg)   SpO2 100%   BMI 30.41 kg/m   Visual Acuity Right Eye Distance:   Left Eye Distance:   Bilateral Distance:    Right Eye Near:   Left Eye Near:    Bilateral Near:     Physical Exam  Pulmonary/Chest:    Chest:  Distinct tenderness to palpation over the lower and mid-sternum.    Nursing notes and Vital Signs reviewed. Appearance:  Patient  appears stated age, and in no acute distress Eyes:  Pupils are equal, round, and reactive to light and accomodation.  Extraocular movement is intact.  Conjunctivae are not inflamed  Ears:  Canals normal.  Tympanic membranes normal.  Nose:  Mildly congested turbinates.  No sinus tenderness.   Pharynx:  Normal Neck:  Supple.  Tender enlarged posterior/lateral nodes are palpated bilaterally, maximally tender on the left.  Lungs:  Clear to auscultation.  Breath sounds are equal.  Moving air well. Heart:  Regular rate and rhythm without murmurs, rubs, or gallops.  Abdomen:  Nontender without masses or hepatosplenomegaly.  Bowel sounds are present.  No CVA or flank tenderness.  Extremities:  No edema.  Skin:  No rash present.     UC Treatments / Results  Labs (all labs ordered are listed, but only abnormal results are displayed) Labs Reviewed - No data to display  EKG  EKG Interpretation   Rate:  77 BPM PR:  148 msec QT:  376 msec QTcH:  425  msec QRSD:  98 msec QRS axis:  -14 degrees Interpretation:  Normal sinus rhythm; no acute changes; within normal limits        Radiology  EXAM: CHEST  2 VIEW  COMPARISON:  None.  FINDINGS: The heart size and mediastinal contours are within normal limits. Both lungs are clear. No pulmonary consolidation or pneumothorax. No effusion. On the lateral view there is subtle cortical irregularity of several anterior ribs possibly representing the fourth and fifth ribs and given the patient's symptoms on the left side, likely the left ribs. These are suspicious for fractures. No associated pneumothorax.  IMPRESSION: Subtle cortical irregularity of several anterior upper ribs likely representing the fourth and fifth ribs suspicious for nondisplaced fractures. These are age indeterminate. No acute pulmonary process.   Electronically Signed   By: Tollie Eth M.D.   On: 11/07/2016 20:56  Procedures Procedures (including critical care time)  Medications Ordered in UC Medications - No data to display   Initial Impression / Assessment and Plan / UC Course  I have reviewed the triage vital signs and the nursing notes.  Pertinent labs & imaging results that were available during my care of the patient were reviewed by me and considered in my medical decision making (see chart for details).    Although there is a slight irregularity in several ribs, patient recalls no injury.  Suspect early viral URI with costochondritis. Begin Mobic.  Rx for Lortab at bedtime. If cold symptoms begin, try the following: Take plain guaifenesin (1200mg  extended release tabs such as Mucinex) twice daily, with plenty of water, for cough and congestion.  May add Pseudoephedrine (30mg , one or two every 4 to 6 hours) for sinus congestion.  Get adequate rest.   Try warm salt water gargles for sore throat.  Stop all antihistamines for now, and other non-prescription cough/cold preparations. May take  Delsym Cough Suppressant at bedtime for nighttime cough.    Put ice on the painful area on chest:  Put ice in a plastic bag.  Place a towel between your skin and the bag.  Leave the ice on for 20 minutes, 2-3 times a day.   Followup with Dr. Rodney Langton or Dr. Clement[facility] ([facility]) if rib pain persists.    Final Clinical Impressions(s) / UC Diagnoses   Final diagnoses:  Chest pain, unspecified type  Acute costochondritis    New Prescriptions New Prescriptions   MELOXICAM (MOBIC) 15 MG TABLET  Take 1 tablet (15 mg total) by mouth daily. Take with food each morning     Adam Haw, MD 11/12/16 2208

## 2016-11-07 NOTE — Discharge Instructions (Signed)
Take Mobic (meloxicam) each morning with food. If cold symptoms begin, try the following: Take plain guaifenesin (1200mg  extended release tabs such as Mucinex) twice daily, with plenty of water, for cough and congestion.  May add Pseudoephedrine (30mg , one or two every 4 to 6 hours) for sinus congestion.  Get adequate rest.   Try warm salt water gargles for sore throat.  Stop all antihistamines for now, and other non-prescription cough/cold preparations. May take Delsym Cough Suppressant at bedtime for nighttime cough.   Put ice on the painful area on chest: Put ice in a plastic bag. Place a towel between your skin and the bag. Leave the ice on for 20 minutes, 2-3 times a day.  Follow-up with family doctor if not improving about10 days.

## 2016-11-23 ENCOUNTER — Encounter: Payer: Self-pay | Admitting: *Deleted

## 2016-11-23 ENCOUNTER — Emergency Department (INDEPENDENT_AMBULATORY_CARE_PROVIDER_SITE_OTHER): Payer: BLUE CROSS/BLUE SHIELD

## 2016-11-23 ENCOUNTER — Emergency Department
Admission: EM | Admit: 2016-11-23 | Discharge: 2016-11-23 | Disposition: A | Discharge status: Home / Self Care | Payer: BLUE CROSS/BLUE SHIELD | Source: Home / Self Care | Attending: Family Medicine | Admitting: Family Medicine

## 2016-11-23 DIAGNOSIS — R0789 Other chest pain: Secondary | ICD-10-CM

## 2016-11-23 DIAGNOSIS — R05 Cough: Secondary | ICD-10-CM

## 2016-11-23 DIAGNOSIS — R079 Chest pain, unspecified: Secondary | ICD-10-CM

## 2016-11-23 DIAGNOSIS — R0781 Pleurodynia: Secondary | ICD-10-CM

## 2016-11-23 LAB — POCT CBC W AUTO DIFF (K'VILLE URGENT CARE)

## 2016-11-23 MED ORDER — AZITHROMYCIN 250 MG PO TABS: ORAL_TABLET | ORAL | 0 refills | Status: DC

## 2016-11-23 MED ORDER — PREDNISONE 20 MG PO TABS: ORAL_TABLET | ORAL | 0 refills | Status: DC

## 2016-11-23 NOTE — ED Provider Notes (Signed)
[facility]    CSN: [id number] Arrival date & time: 11/23/16  1521     History   Chief Complaint Chief Complaint  Patient presents with  . Chest Pain    HPI Adam Adams is a 26 y.o. male.   Patient was evaluated 16 days ago for pain in his left anterior chest.  EKG at that time was negative.  Chest X-ray showed subtle cortical irregularity of several anterior upper ribs likely representing the fourth and fifth ribs suspicious for nondisplaced fractures.  He complains of persistent increased left anterior chest pain now including the left upper back, and increased cough.  He has developed chills at night.  He has noted soreness in his neck.  No sore throat or nasal congestion.  No shortness of breath.  He recalls no chest injury.  He denies pain or swelling in his legs.   The history is provided by the patient and the spouse.  Chest Pain  Pain location:  L chest and L lateral chest Pain quality: sharp   Pain radiates to:  Upper back Pain severity:  Moderate Onset quality:  Sudden Duration:  16 days Timing:  Constant Progression:  Worsening Chronicity:  Recurrent Context: breathing, lifting, movement and at rest   Context: not raising an arm and not trauma   Relieved by:  Certain positions Worsened by:  Coughing, deep breathing and movement Ineffective treatments: NSAID and pain medication. Associated symptoms: cough, diaphoresis and fatigue   Associated symptoms: no abdominal pain, no anorexia, no back pain, no claudication, no dizziness, no dysphagia, no fever, no headache, no heartburn, no lower extremity edema, no nausea, no palpitations, no PND, no shortness of breath and no syncope     History reviewed. No pertinent past medical history.  Patient Active Problem List   Diagnosis Date Noted  . Influenza-like illness 10/04/2016  . Male infertility 03/30/2015  . Ganglion cyst of flexor tendon sheath of finger of right hand 03/07/2015  . Transaminitis  06/02/2014  . Preventive measure 03/29/2013  . Right carpal tunnel syndrome 03/29/2013    History reviewed. No pertinent surgical history.     Home Medications    Prior to Admission medications   Medication Sig Start Date End Date Taking? Authorizing Provider  azithromycin (ZITHROMAX Z-PAK) 250 MG tablet Take 2 tabs today; then begin one tab once daily for 4 more days. 11/23/16   Lattie HawStephen A Beese, MD  HYDROcodone-acetaminophen (NORCO/VICODIN) 5-325 MG tablet Take one by mouth at bedtime as needed for pain 11/07/16   Lattie HawStephen A Beese, MD  meloxicam (MOBIC) 15 MG tablet Take 1 tablet (15 mg total) by mouth daily. Take with food each morning 11/07/16   Lattie HawStephen A Beese, MD  predniSONE (DELTASONE) 20 MG tablet Take one tab by mouth twice daily for 5 days, then one daily. Take with food. 11/23/16   Lattie HawStephen A Beese, MD    Family History History reviewed. No pertinent family history.  Social History Social History  Substance Use Topics  . Smoking status: Never Smoker  . Smokeless tobacco: Never Used  . Alcohol use Not on file     Allergies   Patient has no known allergies.   Review of Systems Review of Systems  Constitutional: Positive for chills, diaphoresis and fatigue. Negative for appetite change and fever.  HENT: Negative for trouble swallowing.   Respiratory: Positive for cough and chest tightness. Negative for shortness of breath and wheezing.   Cardiovascular: Positive for chest pain. Negative for palpitations,  claudication, syncope and PND.  Gastrointestinal: Negative for abdominal pain, anorexia, heartburn and nausea.  Genitourinary: Negative.   Musculoskeletal: Positive for neck stiffness. Negative for back pain.  Skin: Negative.   Neurological: Negative for dizziness and headaches.  All other systems reviewed and are negative.    Physical Exam Triage Vital Signs ED Triage Vitals  Enc Vitals Group     BP 11/23/16 1535 123/82     Pulse Rate 11/23/16 1535 63     Resp  11/23/16 1535 14     Temp 11/23/16 1535 98.3 F (36.8 C)     Temp Source 11/23/16 1535 Oral     SpO2 11/23/16 1535 98 %     Weight 11/23/16 1535 207 lb (93.9 kg)     Height --      Head Circumference --      Peak Flow --      Pain Score 11/23/16 1536 8     Pain Loc --      Pain Edu? --      Excl. in GC? --    No data found.   Updated Vital Signs BP 123/82 (BP Location: Left Arm)   Pulse 63   Temp 98.3 F (36.8 C) (Oral)   Resp 14   Wt 207 lb (93.9 kg)   SpO2 98%   BMI 31.47 kg/m   Visual Acuity Right Eye Distance:   Left Eye Distance:   Bilateral Distance:    Right Eye Near:   Left Eye Near:    Bilateral Near:     Physical Exam  Constitutional: He appears well-developed and well-nourished. No distress.  HENT:  Head: Normocephalic.  Right Ear: External ear normal.  Left Ear: External ear normal.  Nose: Nose normal.  Mouth/Throat: Oropharynx is clear and moist.  Eyes: Conjunctivae are normal. Pupils are equal, round, and reactive to light.  Neck: Neck supple.  Tender enlarged posterior/lateral nodes  Cardiovascular: Normal heart sounds.   Pulmonary/Chest: Breath sounds normal. No respiratory distress. He has no wheezes. He has no rales.     He exhibits tenderness.    There is distinct tenderness to palpation over sternum, left anterior ribs, and left lateral/posterior ribs as noted on diagram.   Abdominal: Soft. There is no tenderness.  Musculoskeletal: He exhibits no edema or tenderness.  Lymphadenopathy:    He has cervical adenopathy.  Neurological: He is alert.  Skin: Skin is warm and dry. No rash noted.  Nursing note and vitals reviewed.    UC Treatments / Results  Labs (all labs ordered are listed, but only abnormal results are displayed) Labs Reviewed  POCT CBC W AUTO DIFF ([facility]):  WBC 10.3; LY 23.9; MO 2.3; GR 73.8; Hgb 15.0; Platelets 330     EKG  EKG Interpretation None       Radiology Dg Ribs Unilateral W/chest  Left  Result Date: 11/23/2016 CLINICAL DATA:  Patient with cough and left-sided chest pain. EXAM: LEFT RIBS AND CHEST - 3+ VIEW COMPARISON:  Chest radiograph 11/07/2016 FINDINGS: Normal cardiac and mediastinal contours. No consolidative pulmonary opacities. No pleural effusion or pneumothorax. No displaced rib fracture. IMPRESSION: No acute cardiopulmonary process. No displaced left-sided rib fracture. Electronically Signed   By: Annia Belt M.D.   On: 11/23/2016 16:21    Procedures Procedures (including critical care time)  Medications Ordered in UC Medications - No data to display   Initial Impression / Assessment and Plan / UC Course  I have reviewed the triage  vital signs and the nursing notes.  Pertinent labs & imaging results that were available during my care of the patient were reviewed by me and considered in my medical decision making (see chart for details).    Note normal white blood count, and negative chest X-ray today (no evidence rib fracture). No risk factors for P.E. ?rheumatological source of rib pain; will check CRP, sed rate, and CMP. Begin prednisone burst/taper. Will begin empiric Z-pack.  May continue pain medication at bedtime if needed. May apply heating pad or ice pack several times daily (whichever is more helpful). Followup with Dr. Rodney Langton in about five days.    Final Clinical Impressions(s) / UC Diagnoses   Final diagnoses:  Chest pain in adult  Rib pain on left side    New Prescriptions New Prescriptions   AZITHROMYCIN (ZITHROMAX Z-PAK) 250 MG TABLET    Take 2 tabs today; then begin one tab once daily for 4 more days.   PREDNISONE (DELTASONE) 20 MG TABLET    Take one tab by mouth twice daily for 5 days, then one daily. Take with food.     Lattie Haw, MD 11/23/16 [phone]

## 2016-11-23 NOTE — Discharge Instructions (Signed)
May continue pain medication at bedtime if needed. May apply heating pad or ice pack several times daily (whichever is more helpful).

## 2016-11-23 NOTE — ED Triage Notes (Signed)
Patient reports CP from 11/07/16 is still present is getting worse. Also c/o some neck stiffness and pain in left upper back. He has also since developed sporadic dry cough.

## 2016-11-24 LAB — COMPREHENSIVE METABOLIC PANEL
ALBUMIN: 4.5 g/dL (ref 3.6–5.1)
ALK PHOS: 54 U/L (ref 40–115)
ALT: 30 U/L (ref 9–46)
AST: 20 U/L (ref 10–40)
BUN: 17 mg/dL (ref 7–25)
CALCIUM: 9.9 mg/dL (ref 8.6–10.3)
CO2: 23 mmol/L (ref 20–31)
Chloride: 105 mmol/L (ref 98–110)
Creat: 0.94 mg/dL (ref 0.60–1.35)
Glucose, Bld: 80 mg/dL (ref 65–99)
POTASSIUM: 4.4 mmol/L (ref 3.5–5.3)
Sodium: 141 mmol/L (ref 135–146)
Total Bilirubin: 0.4 mg/dL (ref 0.2–1.2)
Total Protein: 7.3 g/dL (ref 6.1–8.1)

## 2016-11-24 LAB — SEDIMENTATION RATE: SED RATE: 11 mm/h (ref 0–15)

## 2016-11-25 ENCOUNTER — Telehealth: Payer: Self-pay | Admitting: *Deleted

## 2016-11-25 ENCOUNTER — Telehealth: Payer: Self-pay

## 2016-11-25 LAB — C-REACTIVE PROTEIN: CRP: 7.4 mg/L (ref <8.0)

## 2016-11-25 NOTE — Telephone Encounter (Signed)
Spoke to pt given lab results. He will f/u with Dr T for further evaluation.

## 2016-11-25 NOTE — Telephone Encounter (Signed)
Left VM with information with call back information to book appointment.

## 2016-11-25 NOTE — Telephone Encounter (Signed)
Wife of pt left VM stating pt is still having chest pain after being seen in UC twice. Would like to know what should be done next. Please advise.

## 2016-11-25 NOTE — Telephone Encounter (Signed)
There was a suspicion for rib fractures on his x-ray.  Ultimately I cannot manage chest pain over the phone and he should probably be seen, we would probably end up getting a CT angiogram of his chest as well as setting him up for a stress test to ensure that this is not a cardiac or pulmonary.

## 2016-11-27 ENCOUNTER — Ambulatory Visit: Payer: BLUE CROSS/BLUE SHIELD | Admitting: Sports Medicine

## 2017-03-07 ENCOUNTER — Ambulatory Visit (INDEPENDENT_AMBULATORY_CARE_PROVIDER_SITE_OTHER): Payer: BLUE CROSS/BLUE SHIELD | Admitting: Sports Medicine

## 2017-03-07 ENCOUNTER — Encounter: Payer: Self-pay | Admitting: Sports Medicine

## 2017-03-07 ENCOUNTER — Ambulatory Visit (INDEPENDENT_AMBULATORY_CARE_PROVIDER_SITE_OTHER): Payer: BLUE CROSS/BLUE SHIELD

## 2017-03-07 DIAGNOSIS — R200 Anesthesia of skin: Secondary | ICD-10-CM | POA: Diagnosis not present

## 2017-03-07 DIAGNOSIS — S134XXA Sprain of ligaments of cervical spine, initial encounter: Secondary | ICD-10-CM

## 2017-03-07 DIAGNOSIS — M542 Cervicalgia: Secondary | ICD-10-CM | POA: Diagnosis not present

## 2017-03-07 DIAGNOSIS — G5601 Carpal tunnel syndrome, right upper limb: Secondary | ICD-10-CM

## 2017-03-07 DIAGNOSIS — M5136 Other intervertebral disc degeneration, lumbar region: Secondary | ICD-10-CM

## 2017-03-07 DIAGNOSIS — M545 Low back pain: Secondary | ICD-10-CM | POA: Diagnosis not present

## 2017-03-07 DIAGNOSIS — R202 Paresthesia of skin: Secondary | ICD-10-CM

## 2017-03-07 DIAGNOSIS — M50121 Cervical disc disorder at C4-C5 level with radiculopathy: Secondary | ICD-10-CM

## 2017-03-07 DIAGNOSIS — M51369 Other intervertebral disc degeneration, lumbar region without mention of lumbar back pain or lower extremity pain: Secondary | ICD-10-CM

## 2017-03-07 MED ORDER — CYCLOBENZAPRINE HCL 10 MG PO TABS: ORAL_TABLET | ORAL | 0 refills | Status: DC

## 2017-03-07 MED ORDER — PREDNISONE 50 MG PO TABS: ORAL_TABLET | ORAL | 0 refills | Status: DC
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
## 2017-03-07 NOTE — Assessment & Plan Note (Signed)
Prednisone, Flexeril, x-rays, rehabilitation exercises given, return in one month. Get occasional bilateral radicular pain.

## 2017-03-07 NOTE — Assessment & Plan Note (Signed)
Repeat median nerve hydrodissection on the right, previous was 5 months ago, he is getting a decrease in the duration of efficacy so I am going to set him up with hand surgery to discuss release.

## 2017-03-07 NOTE — Assessment & Plan Note (Signed)
With what sounds to be mild concussive symptoms, prednisone, Flexeril at bedtime, x-rays, home rehabilitation exercises.

## 2017-04-08 ENCOUNTER — Ambulatory Visit: Payer: BLUE CROSS/BLUE SHIELD | Admitting: Sports Medicine

## 2017-04-08 DIAGNOSIS — Z0189 Encounter for other specified special examinations: Secondary | ICD-10-CM

## 2017-06-19 DIAGNOSIS — H93293 Other abnormal auditory perceptions, bilateral: Secondary | ICD-10-CM | POA: Diagnosis not present

## 2017-06-30 ENCOUNTER — Ambulatory Visit (INDEPENDENT_AMBULATORY_CARE_PROVIDER_SITE_OTHER): Payer: BLUE CROSS/BLUE SHIELD | Admitting: Sports Medicine

## 2017-06-30 VITALS — BP 118/76 | HR 64

## 2017-06-30 DIAGNOSIS — Z23 Encounter for immunization: Secondary | ICD-10-CM | POA: Diagnosis not present

## 2017-06-30 NOTE — Progress Notes (Signed)
Pt came into clinic today to get immunizations for school. 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 They require: MMR, Hep B, Varicella, Tdap, Flu, and Tb. School is OK for labs/titers. Pulled [facility], up to date on tdap. Updated his chart. Pt did need flu shot and was given a flu shot questionnaire prior to administration of immunization. All questions were answered no. Pt tolerated injection of flu immunization in left deltoid well, with no immediate complications. Pt also scored 3 on the depression screen, advised to see PCP in the next two weeks for follow up. Appointment made. Advised Pt we would contact him when lab results are in, and to contact our office with any questions/concerns. No further questions.

## 2017-07-02 ENCOUNTER — Ambulatory Visit (INDEPENDENT_AMBULATORY_CARE_PROVIDER_SITE_OTHER): Payer: BLUE CROSS/BLUE SHIELD | Admitting: Sports Medicine

## 2017-07-02 ENCOUNTER — Encounter: Payer: Self-pay | Admitting: Sports Medicine

## 2017-07-02 VITALS — BP 124/79 | HR 75 | Resp 16 | Wt 204.5 lb

## 2017-07-02 DIAGNOSIS — F4310 Post-traumatic stress disorder, unspecified: Secondary | ICD-10-CM | POA: Diagnosis not present

## 2017-07-02 DIAGNOSIS — Z23 Encounter for immunization: Secondary | ICD-10-CM

## 2017-07-02 LAB — QUANTIFERON TB GOLD ASSAY (BLOOD)
Mitogen-Nil: >10 IU/mL
QUANTIFERON(R)-TB GOLD: NEGATIVE
Quantiferon Nil Value: 0.05 IU/mL
Quantiferon Tb Ag Minus Nil Value: 0.02 [IU]/mL

## 2017-07-02 LAB — MEASLES/MUMPS/RUBELLA IMMUNITY
Mumps IgG: 126 [AU]/ml
Rubella: 8.57 {index}
Rubeola IgG: 252 AU/mL

## 2017-07-02 LAB — VARICELLA ZOSTER ANTIBODY, IGG: Varicella IgG: 860.7 index

## 2017-07-02 LAB — HEPATITIS B SURFACE ANTIBODY,QUALITATIVE: Hep B S Ab: NONREACTIVE

## 2017-07-02 MED ORDER — TRAZODONE HCL 50 MG PO TABS: 50.0000 mg | ORAL_TABLET | Freq: Every day | ORAL | 1 refills | Status: DC

## 2017-07-02 NOTE — Assessment & Plan Note (Signed)
Principal issue at this point is insomnia. Adding trazodone 50 mg at bedtime, return in 1 month for PHQ 9 and GAD 7.
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
## 2017-07-14 ENCOUNTER — Ambulatory Visit: Payer: BLUE CROSS/BLUE SHIELD | Admitting: Sports Medicine

## 2017-07-28 ENCOUNTER — Other Ambulatory Visit: Payer: Self-pay

## 2017-07-28 DIAGNOSIS — F4310 Post-traumatic stress disorder, unspecified: Secondary | ICD-10-CM

## 2017-07-28 MED ORDER — TRAZODONE HCL 50 MG PO TABS: 50.0000 mg | ORAL_TABLET | Freq: Every day | ORAL | 0 refills | Status: DC

## 2017-07-30 ENCOUNTER — Encounter: Payer: Self-pay | Admitting: Sports Medicine

## 2017-07-30 ENCOUNTER — Ambulatory Visit (INDEPENDENT_AMBULATORY_CARE_PROVIDER_SITE_OTHER): Payer: BLUE CROSS/BLUE SHIELD | Admitting: Sports Medicine

## 2017-07-30 VITALS — BP 131/77 | HR 88 | Resp 18 | Wt 201.0 lb

## 2017-07-30 DIAGNOSIS — F4310 Post-traumatic stress disorder, unspecified: Secondary | ICD-10-CM | POA: Diagnosis not present

## 2017-07-30 DIAGNOSIS — Z23 Encounter for immunization: Secondary | ICD-10-CM | POA: Diagnosis not present

## 2017-07-30 MED ORDER — TRAZODONE HCL 100 MG PO TABS: 100.0000 mg | ORAL_TABLET | Freq: Every day | ORAL | 3 refills | Status: DC

## 2017-07-30 NOTE — Progress Notes (Signed)
  Subjective:    CC: Anxiety and depression  HPI: Adam Adams returns, he is done much better on low-dose trazodone, sleeping much better as well, still has some anxious and depressive symptoms.  Immunizations: Needs his next shot for his hepatitis A/B regimen.  Past medical history:  Negative.  See flowsheet/record as well for more information.  Surgical history: Negative.  See flowsheet/record as well for more information.  Family history: Negative.  See flowsheet/record as well for more information.  Social history: Negative.  See flowsheet/record as well for more information.  Allergies, and medications have been entered into the medical record, reviewed, and no changes needed.   Review of Systems: No fevers, chills, night sweats, weight loss, chest pain, or shortness of breath.   Objective:    General: Well Developed, well nourished, and in no acute distress.  Neuro: Alert and oriented x3, extra-ocular muscles intact, sensation grossly intact.  HEENT: Normocephalic, atraumatic, pupils equal round reactive to light, neck supple, no masses, no lymphadenopathy, thyroid nonpalpable.  Skin: Warm and dry, no rashes. Cardiac: Regular rate and rhythm, no murmurs rubs or gallops, no lower extremity edema.  Respiratory: Clear to auscultation bilaterally. Not using accessory muscles, speaking in full sentences.  Impression and Recommendations:    Posttraumatic stress disorder Good improvements in insomnia and symptomatology. Increasing trazodone to 100 mg at bedtime, return in 1 month for PHQ and GAD. ___________________________________________ Adam Adams J. Benjamin Stainhekkekandam, M.D., ABFM., CAQSM. Primary Care and Sports Medicine [place] [facility]  Adjunct Instructor of Family Medicine  [employer]

## 2017-07-30 NOTE — Assessment & Plan Note (Signed)
Good improvements in insomnia and symptomatology. Increasing trazodone to 100 mg at bedtime, return in 1 month for PHQ and GAD.

## 2017-08-23 ENCOUNTER — Other Ambulatory Visit: Payer: Self-pay

## 2017-08-23 ENCOUNTER — Emergency Department
Admission: EM | Admit: 2017-08-23 | Discharge: 2017-08-23 | Disposition: A | Discharge status: Home / Self Care | Payer: BLUE CROSS/BLUE SHIELD | Source: Home / Self Care

## 2017-08-23 ENCOUNTER — Encounter: Payer: Self-pay | Admitting: Emergency Medicine

## 2017-08-23 DIAGNOSIS — J209 Acute bronchitis, unspecified: Secondary | ICD-10-CM | POA: Diagnosis not present

## 2017-08-23 MED ORDER — AZITHROMYCIN 250 MG PO TABS: 250.0000 mg | ORAL_TABLET | Freq: Every day | ORAL | 0 refills | Status: DC

## 2017-08-23 NOTE — ED Provider Notes (Signed)
[facility]    CSN: [id number] Arrival date & time: 08/23/17  1210     History   Chief Complaint Chief Complaint  Patient presents with  . Cough    HPI Adam Adams is a 26 y.o. male.   The history is provided by the patient. No language interpreter was used.  Cough  Cough characteristics:  Productive Sputum characteristics:  Green Severity:  Moderate Onset quality:  Gradual Timing:  Constant Progression:  Worsening Chronicity:  New Smoker: no   Context: smoke exposure   Relieved by:  Nothing Worsened by:  Nothing Ineffective treatments:  None tried Associated symptoms: fever   Associated symptoms: no chest pain   Risk factors: no recent infection     History reviewed. No pertinent past medical history.  Patient Active Problem List   Diagnosis Date Noted  . Posttraumatic stress disorder 07/02/2017  . Lumbar degenerative disc disease 03/07/2017  . Male infertility 03/30/2015  . Ganglion cyst of flexor tendon sheath of finger of right hand 03/07/2015  . Preventive measure 03/29/2013  . Right carpal tunnel syndrome 03/29/2013    History reviewed. No pertinent surgical history.     Home Medications    Prior to Admission medications   Medication Sig Start Date End Date Taking? Authorizing Provider  traZODone (DESYREL) 100 MG tablet Take 1 tablet (100 mg total) by mouth at bedtime. 07/30/17   Monica Bectonhekkekandam, Thomas J, MD    Family History History reviewed. No pertinent family history.  Social History Social History   Tobacco Use  . Smoking status: Never Smoker  . Smokeless tobacco: Never Used  Substance Use Topics  . Alcohol use: Not on file  . Drug use: No     Allergies   Patient has no known allergies.   Review of Systems Review of Systems  Constitutional: Positive for fever.  Respiratory: Positive for cough.   Cardiovascular: Negative for chest pain.  All other systems reviewed and are negative.    Physical Exam Triage  Vital Signs ED Triage Vitals  Enc Vitals Group     BP 08/23/17 1321 109/73     Pulse Rate 08/23/17 1321 72     Resp 08/23/17 1321 16     Temp 08/23/17 1321 98.3 F (36.8 C)     Temp Source 08/23/17 1321 Oral     SpO2 08/23/17 1321 99 %     Weight 08/23/17 1322 200 lb (90.7 kg)     Height 08/23/17 1322 5\' 8"  (1.727 m)     Head Circumference --      Peak Flow --      Pain Score --      Pain Loc --      Pain Edu? --      Excl. in GC? --    No data found.  Updated Vital Signs BP 109/73 (BP Location: Right Arm)   Pulse 72   Temp 98.3 F (36.8 C) (Oral)   Resp 16   Ht 5\' 8"  (1.727 m)   Wt 200 lb (90.7 kg)   SpO2 99%   BMI 30.41 kg/m   Visual Acuity Right Eye Distance:   Left Eye Distance:   Bilateral Distance:    Right Eye Near:   Left Eye Near:    Bilateral Near:     Physical Exam  Constitutional: He appears well-developed and well-nourished.  HENT:  Head: Normocephalic and atraumatic.  Eyes: Conjunctivae are normal.  Neck: Neck supple.  Cardiovascular: Normal rate and regular  rhythm.  No murmur heard. Pulmonary/Chest: Effort normal and breath sounds normal. No respiratory distress.  Abdominal: Soft. There is no tenderness.  Musculoskeletal: He exhibits no edema.  Neurological: He is alert.  Skin: Skin is warm and dry.  Psychiatric: He has a normal mood and affect.  Nursing note and vitals reviewed.    UC Treatments / Results  Labs (all labs ordered are listed, but only abnormal results are displayed) Labs Reviewed - No data to display  EKG  EKG Interpretation None       Radiology No results found.  Procedures Procedures (including critical care time)  Medications Ordered in UC Medications - No data to display   Initial Impression / Assessment and Plan / UC Course  I have reviewed the triage vital signs and the nursing notes.  Pertinent labs & imaging results that were available during my care of the patient were reviewed by me and  considered in my medical decision making (see chart for details).     Pt has had a produtive cough for over 2 weeks.   I will treat him with zithromax Pt is advised to recheck with Dr. Karie Schwalbe if he is not better in 3-4 days  Final Clinical Impressions(s) / UC Diagnoses   Final diagnoses:  Acute bronchitis, unspecified organism    ED Discharge Orders    None      Meds ordered this encounter  Medications  . azithromycin (ZITHROMAX) 250 MG tablet    Sig: Take 1 tablet (250 mg total) by mouth daily. Take first 2 tablets together, then 1 every day until finished.    Dispense:  6 tablet    Refill:  0    Order Specific Question:   Supervising Provider    Answer:   Georgina PillionMASSEY, DAVID [[id number]]  An After Visit Summary was printed and given to the patient.   Controlled Substance Prescriptions  Controlled Substance Registry consulted? Not Applicable   Elson AreasSofia, Donnovan Stamour K, [place] 08/23/17 1408

## 2017-08-23 NOTE — Discharge Instructions (Signed)
Return if any problems.

## 2017-08-23 NOTE — ED Triage Notes (Signed)
Patient states he has had a cough for about 2 weeks; no documented elevated temp; some body aches in the morning. No OTC today and declines any here.

## 2017-09-08 ENCOUNTER — Ambulatory Visit: Payer: BLUE CROSS/BLUE SHIELD | Admitting: Sports Medicine

## 2017-09-08 ENCOUNTER — Encounter: Payer: Self-pay | Admitting: Sports Medicine

## 2017-09-08 DIAGNOSIS — R058 Other specified cough: Secondary | ICD-10-CM | POA: Insufficient documentation

## 2017-09-08 DIAGNOSIS — F4310 Post-traumatic stress disorder, unspecified: Secondary | ICD-10-CM | POA: Diagnosis not present

## 2017-09-08 DIAGNOSIS — R05 Cough: Secondary | ICD-10-CM | POA: Diagnosis not present

## 2017-09-08 MED ORDER — TRAZODONE HCL 150 MG PO TABS: 150.0000 mg | ORAL_TABLET | Freq: Every day | ORAL | 3 refills | Status: DC

## 2017-09-08 MED ORDER — BENZONATATE 200 MG PO CAPS: 200.0000 mg | ORAL_CAPSULE | Freq: Three times a day (TID) | ORAL | 0 refills | Status: DC | PRN

## 2017-09-08 MED ORDER — FLUTICASONE PROPIONATE 50 MCG/ACT NA SUSP: NASAL | 3 refills | Status: DC

## 2017-09-08 NOTE — Progress Notes (Signed)
  Subjective:    CC: Depression  HPI: Adam Adams returns, he is done extremely well on trazodone, sleeping better, mood is improved considerably.  No suicidal or homicidal ideation.  Cough: Recent upper respiratory symptoms and now 3 weeks later he still has a mild nonproductive cough but feels okay.  I reviewed the past medical history, family history, social history, surgical history, and allergies today and no changes were needed.  Please see the problem list section below in epic for further details.  Past Medical History: No past medical history on file. Past Surgical History: No past surgical history on file. Social History: Social History   Socioeconomic History  . Marital status: Married    Spouse name: None  . Number of children: None  . Years of education: None  . Highest education level: None  Social Needs  . Financial resource strain: None  . Food insecurity - worry: None  . Food insecurity - inability: None  . Transportation needs - medical: None  . Transportation needs - non-medical: None  Occupational History  . None  Tobacco Use  . Smoking status: Never Smoker  . Smokeless tobacco: Never Used  Substance and Sexual Activity  . Alcohol use: None  . Drug use: No  . Sexual activity: None  Other Topics Concern  . None  Social History Narrative  . None   Family History: No family history on file. Allergies: No Known Allergies Medications: See med rec.  Review of Systems: No fevers, chills, night sweats, weight loss, chest pain, or shortness of breath.   Objective:    General: Well Developed, well nourished, and in no acute distress.  Neuro: Alert and oriented x3, extra-ocular muscles intact, sensation grossly intact.  HEENT: Normocephalic, atraumatic, pupils equal round reactive to light, neck supple, no masses, no lymphadenopathy, thyroid nonpalpable.  Oropharynx, nasopharynx, ear canals unremarkable. Skin: Warm and dry, no rashes. Cardiac: Regular rate  and rhythm, no murmurs rubs or gallops, no lower extremity edema.  Respiratory: Clear to auscultation bilaterally. Not using accessory muscles, speaking in full sentences.  Impression and Recommendations:    Posttraumatic stress disorder Continued good improvements, increasing trazodone to 150. Return in 1 month.  Post-viral cough syndrome With a bit of postnasal drip. Adding Flonase, [employer] Perles, return as needed.  I spent 25 minutes with this patient, greater than 50% was face-to-face time counseling regarding the above diagnoses ___________________________________________ Ihor Austinhomas J. Benjamin Stainhekkekandam, M.D., ABFM., CAQSM. Primary Care and Sports Medicine [facility] [place] [facility]  Adjunct Instructor of Family Medicine  [employer]

## 2017-09-08 NOTE — Assessment & Plan Note (Signed)
With a bit of postnasal drip. Adding Flonase, [employer] Pet[employer]alon Perles, return as needed.

## 2017-09-08 NOTE — Assessment & Plan Note (Signed)
Continued good improvements, increasing trazodone to 150. Return in 1 month.

## 2017-10-08 ENCOUNTER — Encounter: Payer: Self-pay | Admitting: Sports Medicine

## 2017-10-08 ENCOUNTER — Ambulatory Visit: Payer: BLUE CROSS/BLUE SHIELD | Admitting: Sports Medicine

## 2017-10-08 DIAGNOSIS — G5601 Carpal tunnel syndrome, right upper limb: Secondary | ICD-10-CM | POA: Diagnosis not present

## 2017-10-08 DIAGNOSIS — F4310 Post-traumatic stress disorder, unspecified: Secondary | ICD-10-CM

## 2017-10-08 MED ORDER — GABAPENTIN 300 MG PO CAPS
ORAL_CAPSULE | ORAL | 3 refills | Status: DC
Start: 2017-10-08 — End: 2018-01-06

## 2017-10-08 NOTE — Assessment & Plan Note (Signed)
Well-controlled now on trazodone 150, he has in fact forgotten his medication for the past couple of nights. Since his symptoms are controlled I am going to give him the power to taper himself off of it. No suicidal or homicidal ideation

## 2017-10-08 NOTE — Assessment & Plan Note (Signed)
Previous median nerve hydrodissection was 7 months ago on the right. We did ask him to discuss surgical release. If he is having a recurrence of symptoms and is not yet ready to proceed with an operative intervention so he will increase his compliance with his night splint and I am going to add gabapentin at bedtime.

## 2017-10-08 NOTE — Progress Notes (Signed)
Subjective:    CC: Anxiety  HPI: Adam Adams returns, he is a pleasant 27 year old male, we have been treating him with trazodone, overall his symptoms have essentially resolved, he is happy with how things are going and he is even self discontinued his trazodone.  No suicidal or homicidal ideation.  Right carpal tunnel syndrome: Previous injection was last year sometime, now having a recurrence of paresthesias at night, really not wearing his splint.  I reviewed the past medical history, family history, social history, surgical history, and allergies today and no changes were needed.  Please see the problem list section below in epic for further details.  Past Medical History: No past medical history on file. Past Surgical History: No past surgical history on file. Social History: Social History   Socioeconomic History  . Marital status: Married    Spouse name: None  . Number of children: None  . Years of education: None  . Highest education level: None  Social Needs  . Financial resource strain: None  . Food insecurity - worry: None  . Food insecurity - inability: None  . Transportation needs - medical: None  . Transportation needs - non-medical: None  Occupational History  . None  Tobacco Use  . Smoking status: Never Smoker  . Smokeless tobacco: Never Used  Substance and Sexual Activity  . Alcohol use: None  . Drug use: No  . Sexual activity: None  Other Topics Concern  . None  Social History Narrative  . None   Family History: No family history on file. Allergies: No Known Allergies Medications: See med rec.  Review of Systems: No fevers, chills, night sweats, weight loss, chest pain, or shortness of breath.   Objective:    General: Well Developed, well nourished, and in no acute distress.  Neuro: Alert and oriented x3, extra-ocular muscles intact, sensation grossly intact.  HEENT: Normocephalic, atraumatic, pupils equal round reactive to light, neck supple, no  masses, no lymphadenopathy, thyroid nonpalpable.  Skin: Warm and dry, no rashes. Cardiac: Regular rate and rhythm, no murmurs rubs or gallops, no lower extremity edema.  Respiratory: Clear to auscultation bilaterally. Not using accessory muscles, speaking in full sentences. Right wrist: Inspection normal with no visible erythema or swelling. ROM smooth and normal with good flexion and extension and ulnar/radial deviation that is symmetrical with opposite wrist. Palpation is normal over metacarpals, navicular, lunate, and TFCC; tendons without tenderness/ swelling No snuffbox tenderness. No tenderness over Canal of Guyon. Strength 5/5 in all directions without pain. Positive Tinel's and phalens signs. No thenar or hyperthenar atrophy. Negative Finkelstein sign. Negative Watson's test.  Impression and Recommendations:    Posttraumatic stress disorder Well-controlled now on trazodone 150, he has in fact forgotten his medication for the past couple of nights. Since his symptoms are controlled I am going to give him the power to taper himself off of it. No suicidal or homicidal ideation  Right carpal tunnel syndrome Previous median nerve hydrodissection was 7 months ago on the right. We did ask him to discuss surgical release. If he is having a recurrence of symptoms and is not yet ready to proceed with an operative intervention so he will increase his compliance with his night splint and I am going to add gabapentin at bedtime.  I spent 25 minutes with this patient, greater than 50% was face-to-face time counseling regarding the above diagnoses ___________________________________________ Ihor Austinhomas J. Benjamin Stainhekkekandam, M.D., ABFM., CAQSM. Primary Care and Sports Medicine [place] [facility]  Adjunct Instructor of Family  Medicine  [employer]

## 2017-10-21 ENCOUNTER — Ambulatory Visit: Payer: BLUE CROSS/BLUE SHIELD | Admitting: Sports Medicine

## 2017-10-28 ENCOUNTER — Ambulatory Visit: Payer: BLUE CROSS/BLUE SHIELD | Admitting: Sports Medicine

## 2017-10-28 ENCOUNTER — Encounter: Payer: Self-pay | Admitting: Sports Medicine

## 2017-10-28 DIAGNOSIS — G5601 Carpal tunnel syndrome, right upper limb: Secondary | ICD-10-CM | POA: Diagnosis not present

## 2017-10-28 NOTE — Progress Notes (Signed)
Subjective:    CC: Carpal tunnel syndrome  HPI: Adam Adams is a pleasant 27 year old male, we have been treating for right carpal tunnel syndrome for some years now.  Injections are typically lasting about 7 months.  Previous injection was 8 months ago, I asked him to increase his diligence with nighttime splinting, and we added gabapentin at bedtime in the hopes of avoiding another injection, he is noted only a bit of improvement with the above treatment and does have persistence of paresthesias at night, and when working.  Moderate, persistent.  Radiation into the first through fourth fingers.  I reviewed the past medical history, family history, social history, surgical history, and allergies today and no changes were needed.  Please see the problem list section below in epic for further details.  Past Medical History: No past medical history on file. Past Surgical History: No past surgical history on file. Social History: Social History   Socioeconomic History  . Marital status: Married    Spouse name: None  . Number of children: None  . Years of education: None  . Highest education level: None  Social Needs  . Financial resource strain: None  . Food insecurity - worry: None  . Food insecurity - inability: None  . Transportation needs - medical: None  . Transportation needs - non-medical: None  Occupational History  . None  Tobacco Use  . Smoking status: Never Smoker  . Smokeless tobacco: Never Used  Substance and Sexual Activity  . Alcohol use: None  . Drug use: No  . Sexual activity: None  Other Topics Concern  . None  Social History Narrative  . None   Family History: No family history on file. Allergies: No Known Allergies Medications: See med rec.  Review of Systems: No fevers, chills, night sweats, weight loss, chest pain, or shortness of breath.   Objective:    General: Well Developed, well nourished, and in no acute distress.  Neuro: Alert and oriented  x3, extra-ocular muscles intact, sensation grossly intact.  HEENT: Normocephalic, atraumatic, pupils equal round reactive to light, neck supple, no masses, no lymphadenopathy, thyroid nonpalpable.  Skin: Warm and dry, no rashes. Cardiac: Regular rate and rhythm, no murmurs rubs or gallops, no lower extremity edema.  Respiratory: Clear to auscultation bilaterally. Not using accessory muscles, speaking in full sentences. Right wrist: Inspection normal with no visible erythema or swelling. ROM smooth and normal with good flexion and extension and ulnar/radial deviation that is symmetrical with opposite wrist. Palpation is normal over metacarpals, navicular, lunate, and TFCC; tendons without tenderness/ swelling No snuffbox tenderness. No tenderness over Canal of Guyon. Strength 5/5 in all directions without pain. Positive Tinel's and phalens signs. Negative Finkelstein sign. Negative Watson's test.   Procedure: Real-time Ultrasound Guided Hydro dissection of right median nerve at the carpal tunnel Device: GE Logiq E  Verbal informed consent obtained.  Time-out conducted.  Noted no overlying erythema, induration, or other signs of local infection.  Skin prepped in a sterile fashion.  Local anesthesia: Topical Ethyl chloride.  With sterile technique and under real time ultrasound guidance: Using a 25-gauge needle I injected medication both superficial to and deep to the median nerve freeing it from surrounding structures, I then redirected the needle and injected further medication deep around the flexor tendons for a total of 1 cc kenalog 40, 5 cc lidocaine. Completed without difficulty  Pain immediately resolved suggesting accurate placement of the medication.  Advised to call if fevers/chills, erythema, induration, drainage, or persistent  bleeding.  Images permanently stored and available for review in the ultrasound unit.  Impression: Technically successful ultrasound guided  injection.  Impression and Recommendations:    Right carpal tunnel syndrome Repeat median nerve hydrodissection today, really did not get much improvement with the addition of gabapentin and increased diligence with nighttime splinting.  ___________________________________________ Ihor Austinhomas J. Benjamin Stainhekkekandam, M.D., ABFM., CAQSM. Primary Care and Sports Medicine [facility]  Adjunct Instructor of Family Medicine  [employer]

## 2017-10-28 NOTE — Assessment & Plan Note (Signed)
Repeat median nerve hydrodissection today, really did not get much improvement with the addition of gabapentin and increased diligence with nighttime splinting.

## 2017-11-23 ENCOUNTER — Emergency Department
Admission: EM | Admit: 2017-11-23 | Discharge: 2017-11-23 | Disposition: A | Discharge status: Home / Self Care | Payer: BLUE CROSS/BLUE SHIELD | Source: Home / Self Care

## 2017-11-23 ENCOUNTER — Emergency Department (INDEPENDENT_AMBULATORY_CARE_PROVIDER_SITE_OTHER): Payer: BLUE CROSS/BLUE SHIELD

## 2017-11-23 ENCOUNTER — Encounter: Payer: Self-pay | Admitting: Emergency Medicine

## 2017-11-23 DIAGNOSIS — S92522A Displaced fracture of medial phalanx of left lesser toe(s), initial encounter for closed fracture: Secondary | ICD-10-CM

## 2017-11-23 DIAGNOSIS — S92525A Nondisplaced fracture of medial phalanx of left lesser toe(s), initial encounter for closed fracture: Secondary | ICD-10-CM

## 2017-11-23 DIAGNOSIS — W2203XA Walked into furniture, initial encounter: Secondary | ICD-10-CM

## 2017-11-23 DIAGNOSIS — S62625A Displaced fracture of medial phalanx of left ring finger, initial encounter for closed fracture: Secondary | ICD-10-CM | POA: Diagnosis not present

## 2017-11-23 NOTE — ED Triage Notes (Signed)
Patient hit left 4th toe on table today, painful and bruised.

## 2017-11-23 NOTE — Discharge Instructions (Addendum)
Follow up with Dr. Karie Schwalbe for recheck in 1 week

## 2017-11-24 ENCOUNTER — Ambulatory Visit: Payer: BLUE CROSS/BLUE SHIELD | Admitting: Sports Medicine

## 2017-11-24 NOTE — ED Provider Notes (Signed)
[facility]    CSN: [id number] Arrival date & time: 11/23/17  1623     History   Chief Complaint Chief Complaint  Patient presents with  . Toe Injury    HPI Navdeep Kirtland BouchardZavala is a 27 y.o. male.   The history is provided by the patient. No language interpreter was used.  Toe Pain  This is a new problem. The current episode started 6 to 12 hours ago. The problem occurs constantly. The problem has not changed since onset.Nothing aggravates the symptoms. Nothing relieves the symptoms. He has tried nothing for the symptoms.  Pt hit toe on a table.  Pt complains of bruising and swelling  History reviewed. No pertinent past medical history.  Patient Active Problem List   Diagnosis Date Noted  . Posttraumatic stress disorder 07/02/2017  . Lumbar degenerative disc disease 03/07/2017  . Male infertility 03/30/2015  . Ganglion cyst of flexor tendon sheath of finger of right hand 03/07/2015  . Preventive measure 03/29/2013  . Right carpal tunnel syndrome 03/29/2013    History reviewed. No pertinent surgical history.     Home Medications    Prior to Admission medications   Medication Sig Start Date End Date Taking? Authorizing Provider  gabapentin (NEURONTIN) 300 MG capsule 1 tab p.o. nightly, may double dose at bedtime if needed 10/08/17   Monica Bectonhekkekandam, Thomas J, MD  traZODone (DESYREL) 150 MG tablet Take 1 tablet (150 mg total) by mouth at bedtime. 09/08/17   Monica Bectonhekkekandam, Thomas J, MD    Family History No family history on file.  Social History Social History   Tobacco Use  . Smoking status: Never Smoker  . Smokeless tobacco: Never Used  Substance Use Topics  . Alcohol use: Not on file  . Drug use: No     Allergies   Patient has no known allergies.   Review of Systems Review of Systems  All other systems reviewed and are negative.    Physical Exam Triage Vital Signs ED Triage Vitals  Enc Vitals Group     BP 11/23/17 1644 121/76     Pulse Rate  11/23/17 1644 60     Resp --      Temp 11/23/17 1644 98.8 F (37.1 C)     Temp Source 11/23/17 1644 Oral     SpO2 11/23/17 1644 99 %     Weight 11/23/17 1645 197 lb (89.4 kg)     Height 11/23/17 1645 5\' 9"  (1.753 m)     Head Circumference --      Peak Flow --      Pain Score 11/23/17 1645 7     Pain Loc --      Pain Edu? --      Excl. in GC? --    No data found.  Updated Vital Signs BP 121/76 (BP Location: Right Arm)   Pulse 60   Temp 98.8 F (37.1 C) (Oral)   Ht 5\' 9"  (1.753 m)   Wt 197 lb (89.4 kg)   SpO2 99%   BMI 29.09 kg/m   Visual Acuity Right Eye Distance:   Left Eye Distance:   Bilateral Distance:    Right Eye Near:   Left Eye Near:    Bilateral Near:     Physical Exam  Constitutional: He appears well-developed and well-nourished.  Musculoskeletal: He exhibits tenderness.  Bruised swollen 4th toe,  Tender to touch  nv and ns intact  Neurological: He is alert.  Skin: Skin is warm.  Nursing  note and vitals reviewed.    UC Treatments / Results  Labs (all labs ordered are listed, but only abnormal results are displayed) Labs Reviewed - No data to display  EKG None Radiology Dg Foot Complete Left  Result Date: 11/23/2017 CLINICAL DATA:  Blunt trauma to fourth toe with pain, initial encounter EXAM: LEFT FOOT - COMPLETE 3+ VIEW COMPARISON:  None. FINDINGS: There is an avulsion fracture identified from the base of the fourth middle phalanx. No other fracture is seen. No gross soft tissue abnormality is noted. IMPRESSION: Avulsion fracture from the base of the fourth middle phalanx medially. Electronically Signed   By: Alcide Clever M.D.   On: 11/23/2017 17:29    Procedures Procedures (including critical care time)  Medications Ordered in UC Medications - No data to display   Initial Impression / Assessment and Plan / UC Course  I have reviewed the triage vital signs and the nursing notes.  Pertinent labs & imaging results that were available during  my care of the patient were reviewed by me and considered in my medical decision making (see chart for details).     MDM  xrays reviewed,  Pt has an avulsion fracture.  Pt counseled on results.  Pt placed in buddy tape,  He is advised to follow up with Dr. Karie Schwalbe for recheck.   Final Clinical Impressions(s) / UC Diagnoses   Final diagnoses:  Closed nondisplaced fracture of middle phalanx of lesser toe of left foot, initial encounter    ED Discharge Orders    None       Controlled Substance Prescriptions [place] Controlled Substance Registry consulted? Not Applicable   Elson Areas, [place] 11/24/17 1859

## 2017-12-02 ENCOUNTER — Ambulatory Visit: Payer: BLUE CROSS/BLUE SHIELD | Admitting: Sports Medicine

## 2017-12-09 ENCOUNTER — Encounter: Payer: Self-pay | Admitting: Sports Medicine

## 2017-12-09 ENCOUNTER — Ambulatory Visit: Payer: BLUE CROSS/BLUE SHIELD | Admitting: Sports Medicine

## 2017-12-09 DIAGNOSIS — Z0000 Encounter for general adult medical examination without abnormal findings: Secondary | ICD-10-CM | POA: Diagnosis not present

## 2017-12-09 DIAGNOSIS — E559 Vitamin D deficiency, unspecified: Secondary | ICD-10-CM | POA: Diagnosis not present

## 2017-12-09 DIAGNOSIS — S92502A Displaced unspecified fracture of left lesser toe(s), initial encounter for closed fracture: Secondary | ICD-10-CM | POA: Diagnosis not present

## 2017-12-09 DIAGNOSIS — R7989 Other specified abnormal findings of blood chemistry: Secondary | ICD-10-CM | POA: Diagnosis not present

## 2017-12-09 MED ORDER — TRAMADOL HCL 50 MG PO TABS: 50.0000 mg | ORAL_TABLET | Freq: Three times a day (TID) | ORAL | 0 refills | Status: DC | PRN

## 2017-12-09 NOTE — Assessment & Plan Note (Signed)
Essential the pain-free with ambulation, only a bit of discomfort at night. Continue buddy taping. Adding tramadol at bedtime.  I billed a fracture code for this encounter, all subsequent visits will be post-op checks in the global period.

## 2017-12-09 NOTE — Assessment & Plan Note (Signed)
Checking routine labs 

## 2017-12-09 NOTE — Progress Notes (Signed)
Subjective:    CC: Foot injury  HPI: This is a pleasant 27 year old male, 2 weeks ago he kicked a couch, had some pain and swelling in his left fourth toe, was seen in urgent care where x-rays showed a fracture, avulsion from middle phalanx.  He has been buddy taping the toe and he is doing really well, only has a bit of discomfort at night.  I reviewed the past medical history, family history, social history, surgical history, and allergies today and no changes were needed.  Please see the problem list section below in epic for further details.  Past Medical History: No past medical history on file. Past Surgical History: No past surgical history on file. Social History: Social History   Socioeconomic History  . Marital status: Married    Spouse name: Not on file  . Number of children: Not on file  . Years of education: Not on file  . Highest education level: Not on file  Occupational History  . Not on file  Social Needs  . Financial resource strain: Not on file  . Food insecurity:    Worry: Not on file    Inability: Not on file  . Transportation needs:    Medical: Not on file    Non-medical: Not on file  Tobacco Use  . Smoking status: Never Smoker  . Smokeless tobacco: Never Used  Substance and Sexual Activity  . Alcohol use: Not on file  . Drug use: No  . Sexual activity: Not on file  Lifestyle  . Physical activity:    Days per week: Not on file    Minutes per session: Not on file  . Stress: Not on file  Relationships  . Social connections:    Talks on phone: Not on file    Gets together: Not on file    Attends religious service: Not on file    Active member of club or organization: Not on file    Attends meetings of clubs or organizations: Not on file    Relationship status: Not on file  Other Topics Concern  . Not on file  Social History Narrative  . Not on file   Family History: No family history on file. Allergies: No Known Allergies Medications:  See med rec.  Review of Systems: No fevers, chills, night sweats, weight loss, chest pain, or shortness of breath.   Objective:    General: Well Developed, well nourished, and in no acute distress.  Neuro: Alert and oriented x3, extra-ocular muscles intact, sensation grossly intact.  HEENT: Normocephalic, atraumatic, pupils equal round reactive to light, neck supple, no masses, no lymphadenopathy, thyroid nonpalpable.  Skin: Warm and dry, no rashes. Cardiac: Regular rate and rhythm, no murmurs rubs or gallops, no lower extremity edema.  Respiratory: Clear to auscultation bilaterally. Not using accessory muscles, speaking in full sentences. Left foot: No visible erythema or swelling. Range of motion is full in all directions. Strength is 5/5 in all directions. No hallux valgus. No pes cavus or pes planus. No abnormal callus noted. No pain over the navicular prominence, or base of fifth metatarsal. No tenderness to palpation of the calcaneal insertion of plantar fascia. No pain at the Achilles insertion. No pain over the calcaneal bursa. No pain of the retrocalcaneal bursa. No tenderness to palpation over the tarsals, metatarsals, or phalanges. No hallux rigidus or limitus. Only a bit of swelling and tenderness over the fourth toe No pain with compression of the metatarsal heads. Neurovascularly intact distally.  Impression and Recommendations:    Closed fracture of fourth toe of left foot Essential the pain-free with ambulation, only a bit of discomfort at night. Continue buddy taping. Adding tramadol at bedtime.  I billed a fracture code for this encounter, all subsequent visits will be post-op checks in the global period.  Annual physical exam Checking routine labs.  ___________________________________________ [name]. Benjamin Stain, M.D., ABFM., CAQSM. Primary Care and Sports Medicine [facility]  Adjunct Instructor of Family Medicine    [employer]

## 2017-12-10 LAB — CBC
HCT: 44.3 % (ref 38.5–50.0)
Hemoglobin: 15.4 g/dL (ref 13.2–17.1)
MCH: 31.8 pg (ref 27.0–33.0)
MCHC: 34.8 g/dL (ref 32.0–36.0)
MCV: 91.5 fL (ref 80.0–100.0)
MPV: 9.5 fL (ref 7.5–12.5)
Platelets: 356 Thousand/uL (ref 140–400)
RBC: 4.84 Million/uL (ref 4.20–5.80)
RDW: 12.8 % (ref 11.0–15.0)
WBC: 8.2 10*3/uL (ref 3.8–10.8)

## 2017-12-10 LAB — HEMOGLOBIN A1C
Hgb A1c MFr Bld: 5.5 % of total Hgb (ref <5.7)
Mean Plasma Glucose: 111 (calc)
eAG (mmol/L): 6.2 (calc)

## 2017-12-10 LAB — COMPREHENSIVE METABOLIC PANEL
AG Ratio: 2.1 (calc) (ref 1.0–2.5)
ALT: 19 U/L (ref 9–46)
AST: 17 U/L (ref 10–40)
BUN: 16 mg/dL (ref 7–25)
CO2: 29 mmol/L (ref 20–32)
Calcium: 9.8 mg/dL (ref 8.6–10.3)
Glucose, Bld: 89 mg/dL (ref 65–99)
Total Bilirubin: 0.4 mg/dL (ref 0.2–1.2)

## 2017-12-10 LAB — COMPREHENSIVE METABOLIC PANEL WITH GFR
Albumin: 4.7 g/dL (ref 3.6–5.1)
Alkaline phosphatase (APISO): 55 U/L (ref 40–115)
Chloride: 105 mmol/L (ref 98–110)
Creat: 0.86 mg/dL (ref 0.60–1.35)
Globulin: 2.2 g/dL (ref 1.9–3.7)
Potassium: 4.3 mmol/L (ref 3.5–5.3)
Sodium: 140 mmol/L (ref 135–146)
Total Protein: 6.9 g/dL (ref 6.1–8.1)

## 2017-12-10 LAB — LIPID PANEL W/REFLEX DIRECT LDL
Cholesterol: 163 mg/dL (ref <200)
HDL: 65 mg/dL (ref >40)
LDL Cholesterol (Calc): 85 mg/dL (calc)
Non-HDL Cholesterol (Calc): 98 mg/dL (ref <130)
Total CHOL/HDL Ratio: 2.5 (calc) (ref <5.0)
Triglycerides: 58 mg/dL (ref <150)

## 2017-12-10 LAB — VITAMIN D 25 HYDROXY (VIT D DEFICIENCY, FRACTURES): Vit D, 25-Hydroxy: 27 ng/mL — ABNORMAL LOW (ref 30–100)

## 2017-12-10 LAB — TSH: TSH: 1.02 m[IU]/L (ref 0.40–4.50)

## 2017-12-10 MED ORDER — VITAMIN D (ERGOCALCIFEROL) 1.25 MG (50000 UNIT) PO CAPS: 50000.0000 [IU] | ORAL_CAPSULE | ORAL | 0 refills | Status: DC

## 2017-12-10 NOTE — Addendum Note (Signed)
Addended by: Monica BectonHEKKEKANDAM, THOMAS J on: 12/10/2017 07:49 AM   Modules accepted: Orders

## 2018-01-06 ENCOUNTER — Other Ambulatory Visit: Payer: Self-pay | Admitting: Sports Medicine

## 2018-01-06 DIAGNOSIS — G5601 Carpal tunnel syndrome, right upper limb: Secondary | ICD-10-CM

## 2018-01-13 DIAGNOSIS — R109 Unspecified abdominal pain: Secondary | ICD-10-CM | POA: Diagnosis not present

## 2018-01-13 DIAGNOSIS — K760 Fatty (change of) liver, not elsewhere classified: Secondary | ICD-10-CM | POA: Diagnosis not present

## 2018-01-13 DIAGNOSIS — R1031 Right lower quadrant pain: Secondary | ICD-10-CM | POA: Diagnosis not present

## 2018-02-01 ENCOUNTER — Other Ambulatory Visit: Payer: Self-pay | Admitting: Sports Medicine

## 2018-02-28 ENCOUNTER — Other Ambulatory Visit: Payer: Self-pay | Admitting: Sports Medicine

## 2018-03-27 ENCOUNTER — Ambulatory Visit (INDEPENDENT_AMBULATORY_CARE_PROVIDER_SITE_OTHER): Payer: BLUE CROSS/BLUE SHIELD

## 2018-03-27 ENCOUNTER — Ambulatory Visit (INDEPENDENT_AMBULATORY_CARE_PROVIDER_SITE_OTHER): Payer: BLUE CROSS/BLUE SHIELD | Admitting: Sports Medicine

## 2018-03-27 ENCOUNTER — Encounter: Payer: Self-pay | Admitting: Sports Medicine

## 2018-03-27 DIAGNOSIS — M25531 Pain in right wrist: Secondary | ICD-10-CM | POA: Diagnosis not present

## 2018-03-27 DIAGNOSIS — G5601 Carpal tunnel syndrome, right upper limb: Secondary | ICD-10-CM | POA: Diagnosis not present

## 2018-03-27 NOTE — Progress Notes (Signed)
Subjective:    CC: Carpal tunnel  HPI: Adam Adams is a pleasant 27 year old male, he is here with recurrence of numbness and tingling, pain in his volar wrist, moderate, persistent with radiation into the first through third fingers.  Pain is moderate, worsening, and worse with flexion of the wrist.  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  I reviewed the past medical history, family history, social history, surgical history, and allergies today and no changes were needed.  Please see the problem list section below in epic for further details.  Past Medical History: No past medical history on file. Past Surgical History: No past surgical history on file. Social History: Social History   Socioeconomic History  . Marital status: Married    Spouse name: Not on file  . Number of children: Not on file  . Years of education: Not on file  . Highest education level: Not on file  Occupational History  . Not on file  Social Needs  . Financial resource strain: Not on file  . Food insecurity:    Worry: Not on file    Inability: Not on file  . Transportation needs:    Medical: Not on file    Non-medical: Not on file  Tobacco Use  . Smoking status: Never Smoker  . Smokeless tobacco: Never Used  Substance and Sexual Activity  . Alcohol use: Not on file  . Drug use: No  . Sexual activity: Not on file  Lifestyle  . Physical activity:    Days per week: Not on file    Minutes per session: Not on file  . Stress: Not on file  Relationships  . Social connections:    Talks on phone: Not on file    Gets together: Not on file    Attends religious service: Not on file    Active member of club or organization: Not on file    Attends meetings of clubs or organizations: Not on file    Relationship status: Not on file  Other Topics Concern  . Not on file  Social History Narrative  . Not on file   Family History: No family history on file. Allergies: No Known  Allergies Medications: See med rec.  Review of Systems: No fevers, chills, night sweats, weight loss, chest pain, or shortness of breath.   Objective:    General: Well Developed, well nourished, and in no acute distress.  Neuro: Alert and oriented x3, extra-ocular muscles intact, sensation grossly intact.  HEENT: Normocephalic, atraumatic, pupils equal round reactive to light, neck supple, no masses, no lymphadenopathy, thyroid nonpalpable.  Skin: Warm and dry, no rashes. Cardiac: Regular rate and rhythm, no murmurs rubs or gallops, no lower extremity edema.  Respiratory: Clear to auscultation bilaterally. Not using accessory muscles, speaking in full sentences. Right wrist: Inspection normal with no visible erythema or swelling. ROM smooth and normal with good flexion and extension and ulnar/radial deviation that is symmetrical with opposite wrist. Some pain with terminal flexion of the wrist that he localizes over the volar radiocarpal joint. Palpation is normal over metacarpals, navicular, lunate, and TFCC; tendons without tenderness/ swelling No snuffbox tenderness. No tenderness over Canal of Guyon. Strength 5/5 in all directions without pain. Negative tinel's and phalens signs. Negative Finkelstein sign. Negative Watson's test.  Procedure: Real-time Ultrasound Guided hydrodissection of the right median nerve at the carpal tunnel Device: GE Logiq E  Verbal informed consent obtained.  Time-out conducted.  Noted no overlying  erythema, induration, or other signs of local infection.  Skin prepped in a sterile fashion.  Local anesthesia: Topical Ethyl chloride.  With sterile technique and under real time ultrasound guidance: Using a 25-gauge needle advanced into the carpal tunnel, taking care to avoid intraneural injection I injected medication both superficial to and deep to the median nerve freeing it from surrounding structures, I then redirected the needle deep and injected  further medication around the flexor tendons deep within the carpal tunnel for a total of 1 cc kenalog 40, 5 cc lidocaine. Completed without difficulty  Advised to call if fevers/chills, erythema, induration, drainage, or persistent bleeding.  Images permanently stored and available for review in the ultrasound unit.  Impression: Technically successful ultrasound guided median nerve hydrodissection.  Impression and Recommendations:    Right carpal tunnel syndrome Previous injection was about 5 months ago, repeat right median nerve hydrodissection. He does have some pain over the volar aspect of his radiocarpal joint, adding some x-rays, he will wear a nighttime splint for the next 2 weeks and if persistent pain we will get an MRI to rule out carpal AVN. ___________________________________________ [name]. Benjamin Stain, M.D., ABFM., CAQSM. Primary Care and Sports Medicine [facility]  Adjunct Instructor of Family Medicine  [employer]

## 2018-03-27 NOTE — Assessment & Plan Note (Signed)
Previous injection was about 5 months ago, repeat right median nerve hydrodissection. He does have some pain over the volar aspect of his radiocarpal joint, adding some x-rays, he will wear a nighttime splint for the next 2 weeks and if persistent pain we will get an MRI to rule out carpal AVN.

## 2018-07-22 DIAGNOSIS — Z23 Encounter for immunization: Secondary | ICD-10-CM | POA: Diagnosis not present

## 2018-08-18 ENCOUNTER — Ambulatory Visit (INDEPENDENT_AMBULATORY_CARE_PROVIDER_SITE_OTHER): Payer: BLUE CROSS/BLUE SHIELD

## 2018-08-18 ENCOUNTER — Ambulatory Visit (INDEPENDENT_AMBULATORY_CARE_PROVIDER_SITE_OTHER): Payer: BLUE CROSS/BLUE SHIELD | Admitting: Family Medicine

## 2018-08-18 ENCOUNTER — Encounter: Payer: Self-pay | Admitting: Family Medicine

## 2018-08-18 VITALS — BP 119/77 | HR 72 | Ht 67.0 in | Wt 195.0 lb

## 2018-08-18 DIAGNOSIS — M25531 Pain in right wrist: Secondary | ICD-10-CM

## 2018-08-18 MED ORDER — HYDROCODONE-ACETAMINOPHEN 5-325 MG PO TABS: 1.0000 | ORAL_TABLET | Freq: Four times a day (QID) | ORAL | 0 refills | Status: DC | PRN

## 2018-08-18 MED ORDER — PREDNISONE 50 MG PO TABS: 50.0000 mg | ORAL_TABLET | Freq: Every day | ORAL | 0 refills | Status: DC

## 2018-08-18 NOTE — Patient Instructions (Signed)
Thank you for coming in today.  Take a short course of prednisone for 5 days.  Use the brace as needed.  Use hydrocodone sparingly for pain.  Recheck with me or Dr T if not better.    Wrist Sprain, Adult A wrist sprain is a stretch or tear in the strong, fibrous tissues (ligaments) that connect your wrist bones. There are three types of wrist sprains:  Grade 1. In this type of sprain, the ligament is stretched more than normal.  Grade 2. In this type of sprain, the ligament is partially torn. You may be able to move your wrist, but not very much.  Grade 3. In this type of sprain, the ligament or muscle is completely torn. You may find it difficult or extremely painful to move your wrist even a little.  What are the causes? A wrist sprain can be caused by using the wrist too much during sports, exercise, or at work. It can also happen with a fall or during an accident. What increases the risk? This condition is more likely to occur in people:  With a previous wrist or arm injury.  With poor wrist strength and flexibility.  Who play contact sports, such as football or soccer.  Who play sports that may result in a fall, such as skateboarding, biking, skiing, or snowboarding.  Who do not exercise regularly.  Who use exercise equipment that does not fit well.  What are the signs or symptoms? Symptoms of this condition include:  Pain in the wrist, arm, or hand.  Swelling or bruised skin near the wrist, hand, or arm. The skin may look yellow or kind of blue.  Stiffness or trouble moving the hand.  Hearing a pop or feeling a tear at the time of the injury.  A warm feeling in the skin around the wrist.  How is this diagnosed? This condition is diagnosed with a physical exam. Sometimes an X-ray is taken to make sure a bone did not break. If your health care provider thinks that you tore a ligament, he or she may order an MRI of your wrist. How is this treated? This condition  is treated by resting and applying ice to your wrist. Additional treatment may include:  Medicine for pain and inflammation.  A splint to keep your wrist still (immobilized).  Exercises to strengthen and stretch your wrist.  Surgery. This may be done if the ligament is completely torn.  Follow these instructions at home: If you have a splint:   Do not put pressure on any part of the splint until it is fully hardened. This may take several hours.  Wear the splint as told by your health care provider. Remove it only as told by your health care provider.  Loosen the splint if your fingers tingle, become numb, or turn cold and blue.  If your splint is not waterproof: ? Do not let it get wet. ? Cover it with a watertight covering when you take a bath or a shower.  Keep the splint clean. Managing pain, stiffness, and swelling   If directed, put ice on the injured area. ? If you have a removable splint, remove it as told by your health care provider. ? Put ice in a plastic bag. ? Place a towel between your skin and the bag or between the splint and the bag. ? Leave the ice on for 20 minutes, 2-3 times per day.  Move your fingers often to avoid stiffness and to lessen  swelling.  Raise (elevate) the injured area above the level of your heart while you are sitting or lying down. Activity  Rest your wrist. Do not do things that cause pain.  Return to your normal activities as told by your health care provider. Ask your health care provider what activities are safe for you.  Do exercises as told by your health care provider. General instructions  Take over-the-counter and prescription medicines only as told by your health care provider.  Do not use any products that contain nicotine or tobacco, such as cigarettes and e-cigarettes. These can delay healing. If you need help quitting, ask your health care provider.  Ask your health care provider when it is safe to drive if you have  a splint.  Keep all follow-up visits as told by your health care provider. This is important. Contact a health care provider if:  Your pain, bruising, or swelling gets worse.  Your skin becomes red, gets a rash, or has open sores.  Your pain does not get better or it gets worse. Get help right away if:  You have a new or sudden sharp pain in the hand, arm, or wrist.  You have tingling or numbness in your hand.  Your fingers turn white, very red, or cold and blue.  You cannot move your fingers. This information is not intended to replace advice given to you by your health care provider. Make sure you discuss any questions you have with your health care provider. Document Released: 04/22/2014 Document Revised: 03/16/2016 Document Reviewed: 03/07/2016 Elsevier Interactive Patient Education  [employer].

## 2018-08-18 NOTE — Progress Notes (Signed)
Adam Adams is a 27 y.o. male who presents to [facility] today for right wrist pain and swelling.  Zacory was in his normal state of health last week.  On Saturday, December 14 he was visiting a Land for adjustments.  As part of his treatment he noted that he has a history of right carpal tunnel syndrome and the chiropractor offered to adjust his right wrist.  He remembers feeling a pop in his wrist that improved his pain symptoms.  He was not exquisitely painful or very tender right after the procedure.  However the next day his wrist was swollen and painful.  It is been pretty obnoxiously painful and bothersome since then.  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  He had trouble working yesterday and had significant difficulty working today.  He notes his hand and wrist are very bothersome and painful.  Patient has been using a wrist brace which has helped only a little.    ROS:  As above  Exam:  BP 119/77   Pulse 72   Ht 5\' 7"  (1.702 m)   Wt 195 lb (88.5 kg)   BMI 30.54 kg/m  General: Well Developed, well nourished, and in no acute distress.  Neuro/Psych: Alert and oriented x3, extra-ocular muscles intact, able to move all 4 extremities, sensation grossly intact. Skin: Warm and dry, no rashes noted.  Respiratory: Not using accessory muscles, speaking in full sentences, trachea midline.  Cardiovascular: Pulses palpable, no extremity edema. Abdomen: Does not appear distended. MSK:  Right hand and wrist are swollen and tender palpation across the dorsal aspect of the wrist. Decreased wrist and hand motion due to pain and stiffness. Pulses capillary fill and sensation are intact. Elbow normal-appearing nontender normal motion. Shoulder normal-appearing nontender normal motion. No palpable cords or varices in upper extremity.  Left hand and wrist normal-appearing nontender normal motion. Left elbow and  shoulder normal-appearing normal motion nontender.      Lab and Radiology Results X-ray images right wrist personally and independently reviewed No acute fractures or carpal dislocation present. Await formal radiology review.  Limited musculoskeletal ultrasound right wrist Large wrist effusion with no clearly visible tendon disruption.  Normal bony structures.    Assessment and Plan: 27 y.o. male with right wrist pain and swelling following chiropractor treatment.  Unclear etiology.  Patient has a wrist effusion which likely explains his hand and wrist swelling.  He may have suffered a wrist sprain or some other injury following the chiropractic treatment.  This also could be a gout flare or radiographically occult injury.  Plan for wrist brace short course of prednisone and limited hydrocodone.  Recheck if not improving.  Neck step would likely be aspiration and injection.  Patient researched [facility] Controlled Substance Reporting System.     Orders Placed This Encounter  Procedures  . DG Wrist Complete Right    Standing Status:   Future    Number of Occurrences:   1    Standing Expiration Date:   10/20/2019    Order Specific Question:   Reason for Exam (SYMPTOM  OR DIAGNOSIS REQUIRED)    Answer:   eval wrist swelling and pain    Order Specific Question:   Preferred imaging location?    Answer:   [facility]    Order Specific Question:   Radiology Contrast Protocol - do NOT remove file path    Answer:   [email or web address]   Meds ordered this  encounter  Medications  . predniSONE (DELTASONE) 50 MG tablet    Sig: Take 1 tablet (50 mg total) by mouth daily.    Dispense:  5 tablet    Refill:  0  . HYDROcodone-acetaminophen (NORCO/VICODIN) 5-325 MG tablet    Sig: Take 1 tablet by mouth every 6 (six) hours as needed.    Dispense:  8 tablet    Refill:  0    Historical information moved to improve visibility of  documentation.  Past Medical History:  Diagnosis Date  . Male infertility 03/30/2015  . Right carpal tunnel syndrome 03/29/2013   Nerve conduction study shows right carpal tunnel syndrome without evidence of cervical radiculitis.    No past surgical history on file. Social History   Tobacco Use  . Smoking status: Never Smoker  . Smokeless tobacco: Never Used  Substance Use Topics  . Alcohol use: Not on file   family history is not on file.  Medications: Current Outpatient Medications  Medication Sig Dispense Refill  . gabapentin (NEURONTIN) 300 MG capsule 1 CAP NIGHTLY, MAY DOUBLE DOSE AT BEDTIME IF NEEDED 60 capsule 2  . traMADol (ULTRAM) 50 MG tablet Take 1 tablet (50 mg total) by mouth every 8 (eight) hours as needed for moderate pain. Maximum 6 tabs per day. 21 tablet 0  . traZODone (DESYREL) 150 MG tablet Take 1 tablet (150 mg total) by mouth at bedtime. 30 tablet 3  . Vitamin D, Ergocalciferol, (DRISDOL) 50000 units CAPS capsule Take 1 capsule (50,000 Units total) by mouth every 7 (seven) days. Take for 8 total doses(weeks) 8 capsule 0  . HYDROcodone-acetaminophen (NORCO/VICODIN) 5-325 MG tablet Take 1 tablet by mouth every 6 (six) hours as needed. 8 tablet 0  . predniSONE (DELTASONE) 50 MG tablet Take 1 tablet (50 mg total) by mouth daily. 5 tablet 0   No current facility-administered medications for this visit.    No Known Allergies    Discussed warning signs or symptoms. Please see discharge instructions. Patient expresses understanding.

## 2018-08-27 ENCOUNTER — Ambulatory Visit (INDEPENDENT_AMBULATORY_CARE_PROVIDER_SITE_OTHER): Payer: BLUE CROSS/BLUE SHIELD | Admitting: Family Medicine

## 2018-08-27 DIAGNOSIS — Z5329 Procedure and treatment not carried out because of patient's decision for other reasons: Secondary | ICD-10-CM

## 2018-08-27 NOTE — Progress Notes (Signed)
No show

## 2018-08-31 ENCOUNTER — Ambulatory Visit: Payer: BLUE CROSS/BLUE SHIELD | Admitting: Sports Medicine

## 2018-09-07 ENCOUNTER — Encounter: Payer: Self-pay | Admitting: Sports Medicine

## 2018-09-07 ENCOUNTER — Ambulatory Visit (INDEPENDENT_AMBULATORY_CARE_PROVIDER_SITE_OTHER): Payer: BLUE CROSS/BLUE SHIELD | Admitting: Sports Medicine

## 2018-09-07 DIAGNOSIS — S63501A Unspecified sprain of right wrist, initial encounter: Secondary | ICD-10-CM | POA: Diagnosis not present

## 2018-09-07 DIAGNOSIS — M25331 Other instability, right wrist: Secondary | ICD-10-CM | POA: Insufficient documentation

## 2018-09-07 NOTE — Progress Notes (Signed)
Subjective:    CC: Right wrist pain  HPI: This is a pleasant 28 year old male, he was recently at his chiropractor, they did a manipulation to his wrist, immediately he had pain, swelling.  He was seen by Dr. Denyse Amass, x-rays were negative and he was placed in a brace.  Overall improving, but still has persistent pain, moderate, persistent, localized over the dorsum of the radiocarpal joint and worse with terminal extension and flexion of the wrist.  No mechanical symptoms.  I reviewed the past medical history, family history, social history, surgical history, and allergies today and no changes were needed.  Please see the problem list section below in epic for further details.  Past Medical History: Past Medical History:  Diagnosis Date  . Male infertility 03/30/2015  . Right carpal tunnel syndrome 03/29/2013   Nerve conduction study shows right carpal tunnel syndrome without evidence of cervical radiculitis.    Past Surgical History: No past surgical history on file. Social History: Social History   Socioeconomic History  . Marital status: Married    Spouse name: Not on file  . Number of children: Not on file  . Years of education: Not on file  . Highest education level: Not on file  Occupational History  . Not on file  Social Needs  . Financial resource strain: Not on file  . Food insecurity:    Worry: Not on file    Inability: Not on file  . Transportation needs:    Medical: Not on file    Non-medical: Not on file  Tobacco Use  . Smoking status: Never Smoker  . Smokeless tobacco: Never Used  Substance and Sexual Activity  . Alcohol use: Not on file  . Drug use: No  . Sexual activity: Not on file  Lifestyle  . Physical activity:    Days per week: Not on file    Minutes per session: Not on file  . Stress: Not on file  Relationships  . Social connections:    Talks on phone: Not on file    Gets together: Not on file    Attends religious service: Not on file   Active member of club or organization: Not on file    Attends meetings of clubs or organizations: Not on file    Relationship status: Not on file  Other Topics Concern  . Not on file  Social History Narrative  . Not on file   Family History: No family history on file. Allergies: No Known Allergies Medications: See med rec.  Review of Systems: No fevers, chills, night sweats, weight loss, chest pain, or shortness of breath.   Objective:    General: Well Developed, well nourished, and in no acute distress.  Neuro: Alert and oriented x3, extra-ocular muscles intact, sensation grossly intact.  HEENT: Normocephalic, atraumatic, pupils equal round reactive to light, neck supple, no masses, no lymphadenopathy, thyroid nonpalpable.  Skin: Warm and dry, no rashes. Cardiac: Regular rate and rhythm, no murmurs rubs or gallops, no lower extremity edema.  Respiratory: Clear to auscultation bilaterally. Not using accessory muscles, speaking in full sentences. Right wrist: Inspection normal with no visible erythema or swelling. Range of motion full but painful with terminal flexion and extension. Palpation is normal over metacarpals, navicular, lunate, and TFCC; tendons without tenderness/ swelling No snuffbox tenderness. No tenderness over Canal of Guyon. Strength 5/5 in all directions without pain. Negative tinel's and phalens signs. Negative Finkelstein sign. Negative Watson's test.  Impression and Recommendations:    Right  wrist sprain Dorsal swelling and pain after an aggressive chiropractic manipulation of his wrist. Continue Velcro brace for 3 more weeks, x-rays were normal. If insufficient relief after 3 weeks that will take him to 6 weeks out from the injury and we will proceed with an MRI. Avoid provocative activities in the gym. ___________________________________________ [name]. Benjamin Stain, M.D., ABFM., CAQSM. Primary Care and Sports Medicine  [facility]Center  [facility]  Adjunct Professor of Family Medicine  [employer]

## 2018-09-07 NOTE — Assessment & Plan Note (Signed)
Dorsal swelling and pain after an aggressive chiropractic manipulation of his wrist. Continue Velcro brace for 3 more weeks, x-rays were normal. If insufficient relief after 3 weeks that will take him to 6 weeks out from the injury and we will proceed with an MRI. Avoid provocative activities in the gym.

## 2018-09-28 ENCOUNTER — Ambulatory Visit: Payer: BLUE CROSS/BLUE SHIELD | Admitting: Sports Medicine

## 2018-09-29 ENCOUNTER — Ambulatory Visit (INDEPENDENT_AMBULATORY_CARE_PROVIDER_SITE_OTHER): Payer: BLUE CROSS/BLUE SHIELD | Admitting: Sports Medicine

## 2018-09-29 ENCOUNTER — Encounter: Payer: Self-pay | Admitting: Sports Medicine

## 2018-09-29 DIAGNOSIS — S63501D Unspecified sprain of right wrist, subsequent encounter: Secondary | ICD-10-CM | POA: Diagnosis not present

## 2018-09-29 NOTE — Progress Notes (Signed)
Subjective:    CC: Follow-up  HPI: This is a pleasant 28 year old male, he has history of carpal tunnel syndrome, over 6 weeks now he has had a different type of pain over the dorsum of his wrist, worse with terminal extension is and doing push-ups.  Moderate, persistent, localized without radiation or mechanical symptoms.  We have tried 6 weeks of immobilization, he still has not improved.  X-rays have been unrevealing.  I reviewed the past medical history, family history, social history, surgical history, and allergies today and no changes were needed.  Please see the problem list section below in epic for further details.  Past Medical History: Past Medical History:  Diagnosis Date  . Male infertility 03/30/2015  . Right carpal tunnel syndrome 03/29/2013   Nerve conduction study shows right carpal tunnel syndrome without evidence of cervical radiculitis.    Past Surgical History: No past surgical history on file. Social History: Social History   Socioeconomic History  . Marital status: Married    Spouse name: Not on file  . Number of children: Not on file  . Years of education: Not on file  . Highest education level: Not on file  Occupational History  . Not on file  Social Needs  . Financial resource strain: Not on file  . Food insecurity:    Worry: Not on file    Inability: Not on file  . Transportation needs:    Medical: Not on file    Non-medical: Not on file  Tobacco Use  . Smoking status: Never Smoker  . Smokeless tobacco: Never Used  Substance and Sexual Activity  . Alcohol use: Not on file  . Drug use: No  . Sexual activity: Not on file  Lifestyle  . Physical activity:    Days per week: Not on file    Minutes per session: Not on file  . Stress: Not on file  Relationships  . Social connections:    Talks on phone: Not on file    Gets together: Not on file    Attends religious service: Not on file    Active member of club or organization: Not on file   Attends meetings of clubs or organizations: Not on file    Relationship status: Not on file  Other Topics Concern  . Not on file  Social History Narrative  . Not on file   Family History: No family history on file. Allergies: No Known Allergies Medications: See med rec.  Review of Systems: No fevers, chills, night sweats, weight loss, chest pain, or shortness of breath.   Objective:    General: Well Developed, well nourished, and in no acute distress.  Neuro: Alert and oriented x3, extra-ocular muscles intact, sensation grossly intact.  HEENT: Normocephalic, atraumatic, pupils equal round reactive to light, neck supple, no masses, no lymphadenopathy, thyroid nonpalpable.  Skin: Warm and dry, no rashes. Cardiac: Regular rate and rhythm, no murmurs rubs or gallops, no lower extremity edema.  Respiratory: Clear to auscultation bilaterally. Not using accessory muscles, speaking in full sentences. Right wrist: Inspection normal with no visible erythema or swelling. ROM smooth and normal with good flexion and extension and ulnar/radial deviation that is symmetrical with opposite wrist. There is reproduction of pain with terminal extension Palpation is normal over metacarpals, navicular, lunate, and TFCC; tendons without tenderness/ swelling No snuffbox tenderness. No tenderness over Canal of Guyon. Strength 5/5 in all directions without pain. Negative tinel's and phalens signs. Negative Finkelstein sign. Negative Watson's test, negative lunotriquetral  shuck.  Impression and Recommendations:    Right wrist sprain Persistent pain for over 6 weeks now. X-rays unrevealing. This pain is different from his carpal tunnel type pain, he has no numbness or tingling, I am concerned for scapholunate or lunotriquetral injury. Ordering MR arthrogram, I will see him back for the arthrogram injection. ___________________________________________ Ihor Austinhomas J. Benjamin Stainhekkekandam, M.D., ABFM.,  CAQSM. Primary Care and Sports Medicine [facility]  Adjunct Professor of Family Medicine  [employer]

## 2018-09-29 NOTE — Assessment & Plan Note (Signed)
Persistent pain for over 6 weeks now. X-rays unrevealing. This pain is different from his carpal tunnel type pain, he has no numbness or tingling, I am concerned for scapholunate or lunotriquetral injury. Ordering MR arthrogram, I will see him back for the arthrogram injection.

## 2018-09-29 NOTE — Patient Instructions (Signed)
I am concerned for scapholunate or lunotriquetral injury. Ordering MR arthrogram, I will see him back for the arthrogram injection.

## 2018-10-12 ENCOUNTER — Ambulatory Visit (INDEPENDENT_AMBULATORY_CARE_PROVIDER_SITE_OTHER): Payer: BLUE CROSS/BLUE SHIELD | Admitting: Sports Medicine

## 2018-10-12 ENCOUNTER — Encounter: Payer: Self-pay | Admitting: Sports Medicine

## 2018-10-12 ENCOUNTER — Ambulatory Visit (INDEPENDENT_AMBULATORY_CARE_PROVIDER_SITE_OTHER): Payer: BLUE CROSS/BLUE SHIELD

## 2018-10-12 DIAGNOSIS — S63591D Other specified sprain of right wrist, subsequent encounter: Secondary | ICD-10-CM

## 2018-10-12 DIAGNOSIS — S63501D Unspecified sprain of right wrist, subsequent encounter: Secondary | ICD-10-CM

## 2018-10-12 DIAGNOSIS — X58XXXD Exposure to other specified factors, subsequent encounter: Secondary | ICD-10-CM

## 2018-10-12 DIAGNOSIS — S63511A Sprain of carpal joint of right wrist, initial encounter: Secondary | ICD-10-CM | POA: Diagnosis not present

## 2018-10-12 MED ORDER — GADOBUTROL 1 MMOL/ML IV SOLN
1.0000 mL | Freq: Once | INTRAVENOUS | Status: AC | PRN
  Administered 2018-10-12: 1 mL

## 2018-10-12 NOTE — Assessment & Plan Note (Signed)
Persistent pain over 6 weeks, x-rays were unrevealing. Arthrogram injection for MRI arthrogram of the wrist today. My concern is for scapholunate/lunotriquetral injuries.

## 2018-10-12 NOTE — Progress Notes (Signed)
   Procedure: Real-time Ultrasound Guided gadolinium contrast injection of right radiocarpal joint Device: GE Logiq E  Verbal informed consent obtained.  Time-out conducted.  Noted no overlying erythema, induration, or other signs of local infection.  Skin prepped in a sterile fashion.  Local anesthesia: Topical Ethyl chloride.  With sterile technique and under real time ultrasound guidance: 22-gauge needle advanced into the radiocarpal joint, injected 1 cc Kenalog 40, 1 cc lidocaine, syringe switched and 0.05 cc gadolinium injected, syringe again switched and approximately 1 to 2 cc of sterile saline used to flush the needle and distend the joint fully. Joint visualized and capsule seen distending confirming intra-articular placement of contrast material and medication. Completed without difficulty  Advised to call if fevers/chills, erythema, induration, drainage, or persistent bleeding.  Images permanently stored and available for review in the ultrasound unit.  Impression: Technically successful ultrasound guided gadolinium contrast injection for MR arthrography.  Please see separate MR arthrogram report.

## 2018-10-13 ENCOUNTER — Encounter: Payer: Self-pay | Admitting: Sports Medicine

## 2018-10-13 DIAGNOSIS — S63501D Unspecified sprain of right wrist, subsequent encounter: Secondary | ICD-10-CM

## 2018-10-13 DIAGNOSIS — M25331 Other instability, right wrist: Secondary | ICD-10-CM

## 2018-11-18 ENCOUNTER — Ambulatory Visit: Payer: BLUE CROSS/BLUE SHIELD | Admitting: Sports Medicine

## 2018-11-20 ENCOUNTER — Encounter: Payer: Self-pay | Admitting: Sports Medicine

## 2018-11-20 ENCOUNTER — Ambulatory Visit (INDEPENDENT_AMBULATORY_CARE_PROVIDER_SITE_OTHER): Payer: BLUE CROSS/BLUE SHIELD | Admitting: Sports Medicine

## 2018-11-20 ENCOUNTER — Other Ambulatory Visit: Payer: Self-pay

## 2018-11-20 DIAGNOSIS — G5601 Carpal tunnel syndrome, right upper limb: Secondary | ICD-10-CM

## 2018-11-20 DIAGNOSIS — M25331 Other instability, right wrist: Secondary | ICD-10-CM | POA: Diagnosis not present

## 2018-11-20 NOTE — Assessment & Plan Note (Signed)
Previous injection was July 2019. Repeat right median nerve hydrodissection.

## 2018-11-20 NOTE — Progress Notes (Signed)
Subjective:    CC: Carpal tunnel  HPI: Adam Adams is a pleasant 28 year old male, he has carpal tunnel syndrome, he returns with worsening of symptoms, localized in the hand with radiation to the fingertips, numbness and tingling, worse at night.  In addition she has a scapholunate dissociation, we placed some steroid in the MR arthrogram injection, and this pain has resolved.  I reviewed the past medical history, family history, social history, surgical history, and allergies today and no changes were needed.  Please see the problem list section below in epic for further details.  Past Medical History: Past Medical History:  Diagnosis Date  . Male infertility 03/30/2015  . Right carpal tunnel syndrome 03/29/2013   Nerve conduction study shows right carpal tunnel syndrome without evidence of cervical radiculitis.    Past Surgical History: No past surgical history on file. Social History: Social History   Socioeconomic History  . Marital status: Married    Spouse name: Not on file  . Number of children: Not on file  . Years of education: Not on file  . Highest education level: Not on file  Occupational History  . Not on file  Social Needs  . Financial resource strain: Not on file  . Food insecurity:    Worry: Not on file    Inability: Not on file  . Transportation needs:    Medical: Not on file    Non-medical: Not on file  Tobacco Use  . Smoking status: Never Smoker  . Smokeless tobacco: Never Used  Substance and Sexual Activity  . Alcohol use: Not on file  . Drug use: No  . Sexual activity: Not on file  Lifestyle  . Physical activity:    Days per week: Not on file    Minutes per session: Not on file  . Stress: Not on file  Relationships  . Social connections:    Talks on phone: Not on file    Gets together: Not on file    Attends religious service: Not on file    Active member of club or organization: Not on file    Attends meetings of clubs or organizations: Not  on file    Relationship status: Not on file  Other Topics Concern  . Not on file  Social History Narrative  . Not on file   Family History: No family history on file. Allergies: No Known Allergies Medications: See med rec.  Review of Systems: No fevers, chills, night sweats, weight loss, chest pain, or shortness of breath.   Objective:    General: Well Developed, well nourished, and in no acute distress.  Neuro: Alert and oriented x3, extra-ocular muscles intact, sensation grossly intact.  HEENT: Normocephalic, atraumatic, pupils equal round reactive to light, neck supple, no masses, no lymphadenopathy, thyroid nonpalpable.  Skin: Warm and dry, no rashes. Cardiac: Regular rate and rhythm, no murmurs rubs or gallops, no lower extremity edema.  Respiratory: Clear to auscultation bilaterally. Not using accessory muscles, speaking in full sentences. Right wrist: Inspection normal with no visible erythema or swelling. ROM smooth and normal with good flexion and extension and ulnar/radial deviation that is symmetrical with opposite wrist. Palpation is normal over metacarpals, navicular, lunate, and TFCC; tendons without tenderness/ swelling No snuffbox tenderness. No tenderness over Canal of Guyon. Strength 5/5 in all directions without pain. Positive Tinel's and phalens signs. Negative Finkelstein sign. Negative Watson's test.  Procedure: Real-time Ultrasound Guided hydrodissection of the right median nerve at the carpal tunnel Device: GE Logiq E  Verbal informed consent obtained.  Time-out conducted.  Noted no overlying erythema, induration, or other signs of local infection.  Skin prepped in a sterile fashion.  Local anesthesia: Topical Ethyl chloride.  With sterile technique and under real time ultrasound guidance: Using a 25-gauge needle advanced into the carpal tunnel, taking care to avoid intraneural injection I injected medication both superficial to and deep to the median  nerve freeing it from surrounding structures, I then redirected the needle deep and injected further medication around the flexor tendons deep within the carpal tunnel for a total of 1 cc kenalog 40, 5 cc lidocaine. Completed without difficulty  Advised to call if fevers/chills, erythema, induration, drainage, or persistent bleeding.  Images permanently stored and available for review in the ultrasound unit.  Impression: Technically successful ultrasound guided median nerve hydrodissection.  Impression and Recommendations:    Right carpal tunnel syndrome Previous injection was July 2019. Repeat right median nerve hydrodissection.  Scapholunate dissociation of right wrist He does have a scapholunate injury on MRI however pain is now resolved secondary to the steroid I placed in his MR arthrogram. I think he can cancel the appointment with his wrist surgeon. Return as needed for this.   ___________________________________________ [name]. Benjamin Stain, M.D., ABFM., CAQSM. Primary Care and Sports Medicine  [facility]  Adjunct Professor of Family Medicine  [employer]

## 2018-11-20 NOTE — Assessment & Plan Note (Signed)
He does have a scapholunate injury on MRI however pain is now resolved secondary to the steroid I placed in his MR arthrogram. I think he can cancel the appointment with his wrist surgeon. Return as needed for this.

## 2018-12-28 DIAGNOSIS — R05 Cough: Secondary | ICD-10-CM | POA: Diagnosis not present

## 2018-12-28 DIAGNOSIS — Z20828 Contact with and (suspected) exposure to other viral communicable diseases: Secondary | ICD-10-CM | POA: Diagnosis not present

## 2018-12-30 DIAGNOSIS — Z03818 Encounter for observation for suspected exposure to other biological agents ruled out: Secondary | ICD-10-CM | POA: Diagnosis not present

## 2018-12-30 DIAGNOSIS — Z7689 Persons encountering health services in other specified circumstances: Secondary | ICD-10-CM | POA: Diagnosis not present

## 2018-12-30 DIAGNOSIS — R05 Cough: Secondary | ICD-10-CM | POA: Diagnosis not present

## 2019-02-15 ENCOUNTER — Ambulatory Visit: Payer: BLUE CROSS/BLUE SHIELD | Admitting: Sports Medicine

## 2019-02-22 ENCOUNTER — Ambulatory Visit (INDEPENDENT_AMBULATORY_CARE_PROVIDER_SITE_OTHER): Payer: BC Managed Care – PPO | Admitting: Sports Medicine

## 2019-02-22 ENCOUNTER — Encounter: Payer: Self-pay | Admitting: Sports Medicine

## 2019-02-22 DIAGNOSIS — M25331 Other instability, right wrist: Secondary | ICD-10-CM

## 2019-02-22 NOTE — Progress Notes (Signed)
Subjective:    CC: Right wrist pain  HPI: Adam Adams is a very pleasant 28 year old male, he has a history of carpal tunnel syndrome and scapholunate dissociation, his last median nerve hydrodissection was in March, this continues to do well, no paresthesias into the hand.  He did have persistent pain, we did an MR arthrogram back in February that revealed contrast leaking into the midcarpal row, this confirmed a diagnosis of a scapholunate ligament tear.  He did well for several months, we did place steroid in the arthrogram injection.  He now has a recurrence of pain, moderate, persistent, localized dorsally without radiation.  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  I reviewed the past medical history, family history, social history, surgical history, and allergies today and no changes were needed.  Please see the problem list section below in epic for further details.  Past Medical History: Past Medical History:  Diagnosis Date  . Male infertility 03/30/2015  . Right carpal tunnel syndrome 03/29/2013   Nerve conduction study shows right carpal tunnel syndrome without evidence of cervical radiculitis.    Past Surgical History: No past surgical history on file. Social History: Social History   Socioeconomic History  . Marital status: Married    Spouse name: Not on file  . Number of children: Not on file  . Years of education: Not on file  . Highest education level: Not on file  Occupational History  . Not on file  Social Needs  . Financial resource strain: Not on file  . Food insecurity    Worry: Not on file    Inability: Not on file  . Transportation needs    Medical: Not on file    Non-medical: Not on file  Tobacco Use  . Smoking status: Never Smoker  . Smokeless tobacco: Never Used  Substance and Sexual Activity  . Alcohol use: Not on file  . Drug use: No  . Sexual activity: Not on file  Lifestyle  .  Physical activity    Days per week: Not on file    Minutes per session: Not on file  . Stress: Not on file  Relationships  . Social Herbalist on phone: Not on file    Gets together: Not on file    Attends religious service: Not on file    Active member of club or organization: Not on file    Attends meetings of clubs or organizations: Not on file    Relationship status: Not on file  Other Topics Concern  . Not on file  Social History Narrative  . Not on file   Family History: No family history on file. Allergies: No Known Allergies Medications: See med rec.  Review of Systems: No fevers, chills, night sweats, weight loss, chest pain, or shortness of breath.   Objective:    General: Well Developed, well nourished, and in no acute distress.  Neuro: Alert and oriented x3, extra-ocular muscles intact, sensation grossly intact.  HEENT: Normocephalic, atraumatic, pupils equal round reactive to light, neck supple, no masses, no lymphadenopathy, thyroid nonpalpable.  Skin: Warm and dry, no rashes. Cardiac: Regular rate and rhythm, no murmurs rubs or gallops, no lower extremity edema.  Respiratory: Clear to auscultation bilaterally. Not using accessory muscles, speaking in full sentences. Right wrist: Inspection normal with no visible erythema or swelling. ROM smooth and normal with good flexion and extension and ulnar/radial deviation that  is symmetrical with opposite wrist. Palpation is normal over metacarpals, navicular, lunate, and TFCC; tendons without tenderness/ swelling, there is a bit of tenderness over the dorsal radiocarpal joint No snuffbox tenderness. No tenderness over Canal of Guyon. Strength 5/5 in all directions without pain. Negative tinel's and phalens signs. Negative Finkelstein sign. Negative Watson's test. Negative lunotriquetral shuck test.  Procedure: Real-time Ultrasound Guided injection of the right radiocarpal joint Device: GE Logiq E   Verbal informed consent obtained.  Time-out conducted.  Noted no overlying erythema, induration, or other signs of local infection.  Skin prepped in a sterile fashion.  Local anesthesia: Topical Ethyl chloride.  With sterile technique and under real time ultrasound guidance:  25-gauge needle advanced into the joint skinning over the lunate, I then injected 1 cc Kenalog 40, 1 cc lidocaine easily. Completed without difficulty  Pain immediately resolved suggesting accurate placement of the medication.  Advised to call if fevers/chills, erythema, induration, drainage, or persistent bleeding.  Images permanently stored and available for review in the ultrasound unit.  Impression: Technically successful ultrasound guided injection.  Impression and Recommendations:    Scapholunate dissociation of right wrist Scapholunate injury on MRI, we did an arthrogram back in February, his pain improved from the steroid and the injection. Now with recurrence of pain, he has not seen the wrist surgeon. He is starting basic training. I did perform a radiocarpal joint injection today.   ___________________________________________ Adam Adams, M.D., ABFM., CAQSM. Primary Care and Sports Medicine [facility]  Adjunct Professor of Family Medicine  [employer]

## 2019-02-22 NOTE — Assessment & Plan Note (Signed)
Scapholunate injury on MRI, we did an arthrogram back in February, his pain improved from the steroid and the injection. Now with recurrence of pain, he has not seen the wrist surgeon. He is starting basic training. I did perform a radiocarpal joint injection today.

## 2019-08-23 ENCOUNTER — Other Ambulatory Visit: Payer: Self-pay

## 2019-08-23 ENCOUNTER — Encounter: Payer: Self-pay | Admitting: Sports Medicine

## 2019-08-23 ENCOUNTER — Ambulatory Visit (INDEPENDENT_AMBULATORY_CARE_PROVIDER_SITE_OTHER): Payer: BC Managed Care – PPO

## 2019-08-23 ENCOUNTER — Ambulatory Visit (INDEPENDENT_AMBULATORY_CARE_PROVIDER_SITE_OTHER): Payer: BC Managed Care – PPO | Admitting: Sports Medicine

## 2019-08-23 DIAGNOSIS — J300 Vasomotor rhinitis: Secondary | ICD-10-CM | POA: Insufficient documentation

## 2019-08-23 DIAGNOSIS — M25512 Pain in left shoulder: Secondary | ICD-10-CM | POA: Diagnosis not present

## 2019-08-23 DIAGNOSIS — M25331 Other instability, right wrist: Secondary | ICD-10-CM

## 2019-08-23 DIAGNOSIS — G8929 Other chronic pain: Secondary | ICD-10-CM | POA: Insufficient documentation

## 2019-08-23 MED ORDER — FLUTICASONE PROPIONATE 50 MCG/ACT NA SUSP: NASAL | 3 refills | Status: DC

## 2019-08-23 NOTE — Assessment & Plan Note (Signed)
Adding intranasal fluticasone.

## 2019-08-23 NOTE — Assessment & Plan Note (Signed)
Scapholunate injury on MRI from the summertime, he did well with an injection, unfortunately his appointment was canceled with hand surgery. In severe pain today, so repeat radiocarpal injection with ultrasound guidance performed today, we are going to get him back in with hand surgery. He did finished basic training with the [employer].

## 2019-08-23 NOTE — Assessment & Plan Note (Signed)
History is has both impingement and labral exam findings. Adding x-rays, formal physical therapy, return in 6 weeks, MR arthrogram if no better.

## 2019-08-23 NOTE — Progress Notes (Signed)
Subjective:    CC: Right wrist pain  HPI: Adam Adams returns, he is a 28 year old male, just finished basic training, he has severe pain in his dorsal wrist, he does have MRI arthrogram confirms scapholunate dissociation, we injected it and he had short-term relief but he was supposed to go to the [employer].  He never did this.  Ultimately it sounds as though his surgery was canceled.  In addition he has pain in his left shoulder, worse over the deltoid and worse with overhead activities, he feels as though it is from his activities with basic training.  I reviewed the past medical history, family history, social history, surgical history, and allergies today and no changes were needed.  Please see the problem list section below in epic for further details.  Past Medical History: Past Medical History:  Diagnosis Date  . Male infertility 03/30/2015  . Right carpal tunnel syndrome 03/29/2013   Nerve conduction study shows right carpal tunnel syndrome without evidence of cervical radiculitis.    Past Surgical History: No past surgical history on file. Social History: Social History   Socioeconomic History  . Marital status: Married    Spouse name: Not on file  . Number of children: Not on file  . Years of education: Not on file  . Highest education level: Not on file  Occupational History  . Not on file  Tobacco Use  . Smoking status: Never Smoker  . Smokeless tobacco: Never Used  Substance and Sexual Activity  . Alcohol use: Not on file  . Drug use: No  . Sexual activity: Not on file  Other Topics Concern  . Not on file  Social History Narrative  . Not on file   Social Determinants of Health   Financial Resource Strain:   . Difficulty of Paying Living Expenses: Not on file  Food Insecurity:   . Worried About Programme researcher, broadcasting/film/video in the Last Year: Not on file  . Ran Out of Food in the Last Year: Not on file  Transportation Needs:   . Lack of Transportation (Medical): Not  on file  . Lack of Transportation (Non-Medical): Not on file  Physical Activity:   . Days of Exercise per Week: Not on file  . Minutes of Exercise per Session: Not on file  Stress:   . Feeling of Stress : Not on file  Social Connections:   . Frequency of Communication with Friends and Family: Not on file  . Frequency of Social Gatherings with Friends and Family: Not on file  . Attends Religious Services: Not on file  . Active Member of Clubs or Organizations: Not on file  . Attends Banker Meetings: Not on file  . Marital Status: Not on file   Family History: No family history on file. Allergies: No Known Allergies Medications: See med rec.  Review of Systems: No fevers, chills, night sweats, weight loss, chest pain, or shortness of breath.   Objective:    General: Well Developed, well nourished, and in no acute distress.  Neuro: Alert and oriented x3, extra-ocular muscles intact, sensation grossly intact.  HEENT: Normocephalic, atraumatic, pupils equal round reactive to light, neck supple, no masses, no lymphadenopathy, thyroid nonpalpable.  Skin: Warm and dry, no rashes. Cardiac: Regular rate and rhythm, no murmurs rubs or gallops, no lower extremity edema.  Respiratory: Clear to auscultation bilaterally. Not using accessory muscles, speaking in full sentences. Right wrist: Inspection normal with no visible erythema or swelling. ROM smooth and  normal with good flexion and extension and ulnar/radial deviation that is symmetrical with opposite wrist. Tender palpation dorsally over the radiocarpal joint between the scaphoid and the lunate. No snuffbox tenderness. No tenderness over Canal of Guyon. Strength 5/5 in all directions without pain. Negative tinel's and phalens signs. Negative Finkelstein sign. Negative Watson's test. Left shoulder: Inspection reveals no abnormalities, atrophy or asymmetry. Palpation is normal with no tenderness over AC joint or  bicipital groove. ROM is full in all planes. Rotator cuff strength normal throughout. Positive Neer and Hawkin's tests, empty can. Speeds and Yergason's tests normal. Positive Obrien's, negative crank, negative clunk, and good stability. Normal scapular function observed. No painful arc and no drop arm sign. No apprehension sign  Procedure: Real-time Ultrasound Guided injection of the right radiocarpal joint Device: Samsung HS60  Verbal informed consent obtained.  Time-out conducted.  Noted no overlying erythema, induration, or other signs of local infection.  Skin prepped in a sterile fashion.  Local anesthesia: Topical Ethyl chloride.  With sterile technique and under real time ultrasound guidance:  1 cc Kenalog 40, 1 cc lidocaine, 1 cc bupivacaine injected easily completed without difficulty  Pain immediately resolved suggesting accurate placement of the medication.  Advised to call if fevers/chills, erythema, induration, drainage, or persistent bleeding.  Images permanently stored and available for review in the ultrasound unit.  Impression: Technically successful ultrasound guided injection.  Impression and Recommendations:    Scapholunate dissociation of right wrist Scapholunate injury on MRI from the summertime, he did well with an injection, unfortunately his appointment was canceled with hand surgery. In severe pain today, so repeat radiocarpal injection with ultrasound guidance performed today, we are going to get him back in with hand surgery. He did finished basic training with the [employer].  Irritant rhinitis Adding intranasal fluticasone.  Acute pain of left shoulder History is has both impingement and labral exam findings. Adding x-rays, formal physical therapy, return in 6 weeks, MR arthrogram if no better.   ___________________________________________ Gwen Her. Dianah Field, M.D., ABFM., CAQSM. Primary Care and Sports Medicine [facility]  [facility]  Adjunct Professor of [employer]

## 2019-09-01 ENCOUNTER — Ambulatory Visit: Payer: BC Managed Care – PPO | Admitting: Physical Therapy

## 2019-09-01 ENCOUNTER — Encounter: Payer: Self-pay | Admitting: Physical Therapy

## 2019-09-01 ENCOUNTER — Other Ambulatory Visit: Payer: Self-pay

## 2019-09-01 DIAGNOSIS — M25512 Pain in left shoulder: Secondary | ICD-10-CM | POA: Diagnosis not present

## 2019-09-01 DIAGNOSIS — M6281 Muscle weakness (generalized): Secondary | ICD-10-CM

## 2019-09-01 NOTE — Therapy (Signed)
[facility] [address] [place], [place], [address] Phone: [phone]   Fax:  [phone]  Physical Therapy Evaluation  Patient Details  Name: Adam Adams MRN: [id number] Date of Birth: [birth year] Referring Provider (PT): Dr. Benjamin Stainhekkekandam   Encounter Date: 09/01/2019  PT End of Session - 09/01/19 0900    Visit Number  1    Number of Visits  13    Date for PT Re-Evaluation  10/27/19    PT Start Time  0845    PT Stop Time  0930    PT Time Calculation (min)  45 min    Activity Tolerance  Patient tolerated treatment well    Behavior During Therapy  [facility] for tasks assessed/performed       Past Medical History:  Diagnosis Date  . Male infertility 03/30/2015  . Right carpal tunnel syndrome 03/29/2013   Nerve conduction study shows right carpal tunnel syndrome without evidence of cervical radiculitis.     History reviewed. No pertinent surgical history.  There were no vitals filed for this visit.   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 Pt also reporting a fall off one of the obstacles where he fell on his left side. Pt reporting 0 pain at rest and 5/10 with overhead reaching movements. Pt reporting pain with getting out of bed pushing up with his left arm, push ups, running, overhead activities and lifting.    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    Limitations  Lifting;Other (comment)    Patient Stated Goals  Stop hurting    Currently in Pain?  Yes    Pain Score  5     Pain Location  Shoulder    Pain Orientation  Left    Pain Descriptors / Indicators  Aching    Pain Type  Acute pain    Pain Onset  More than a month ago    Pain Frequency  Intermittent    Aggravating Factors   reaching, pushing, doing push up, pushing up on L shoulder when getting out  of bed, reaching across the body    Pain Relieving Factors  resting         [facility] PT Assessment - 09/01/19 0001      Assessment   Medical Diagnosis  m25.512 L acute shoulder pain    Referring Provider (PT)  Dr. Benjamin Stainhekkekandam    Hand Dominance  Right    Prior Therapy  no      Precautions   Precautions  None      Restrictions   Weight Bearing Restrictions  No      Balance Screen   Has the patient fallen in the past 6 months  Yes    How many times?  1    Has the patient had a decrease in activity level because of a fear of falling?   Yes    Is the patient reluctant to leave their home because of a fear of falling?   No      Home Public house managernvironment   Living Environment  Private residence      Prior Function   Level of Independence  Independent    Vocation  Full time employment    Journalist, newspaperVocation Requirements  military reserve      Cognition   Overall Cognitive  Status  Within Functional Limits for tasks assessed      Observation/Other Assessments   Focus on Therapeutic Outcomes (FOTO)   34 % limitation      Posture/Postural Control   Posture/Postural Control  Postural limitations    Postural Limitations  Rounded Shoulders      ROM / Strength   AROM / PROM / Strength  AROM;PROM;Strength      AROM   AROM Assessment Site  Shoulder    Right/Left Shoulder  Right;Left    Right Shoulder Extension  52 Degrees    Right Shoulder Flexion  160 Degrees    Right Shoulder ABduction  155 Degrees    Right Shoulder External Rotation  80 Degrees    Left Shoulder Extension  52 Degrees    Left Shoulder Flexion  150 Degrees    Left Shoulder ABduction  158 Degrees    Left Shoulder External Rotation  75 Degrees      Strength   Strength Assessment Site  Shoulder    Right/Left Shoulder  Right;Left    Right Shoulder Extension  5/5    Right Shoulder ABduction  5/5    Right Shoulder External Rotation  5/5    Right Shoulder Horizontal ABduction  5/5    Left Shoulder Flexion  4+/5    Left Shoulder  Extension  4+/5    Left Shoulder ABduction  4+/5    Left Shoulder External Rotation  4+/5      Palpation   Palpation comment  tenderness to palpation on superior and posterior capsule of L shoulder, tightness noted in L upper trap      Ambulation/Gait   Gait Comments  Normalized gait pattern                Objective measurements completed on examination: See above findings.      [facility] Adult PT Treatment/Exercise - 09/01/19 0001      Exercises   Exercises  Shoulder      Shoulder Exercises: Supine   External Rotation  AAROM;5 reps;Limitations    External Rotation Limitations  holding 3 seconds in painfree Rom    Flexion  AAROM;5 reps;Limitations    Flexion Limitations  holding 3 seconds in painfree ROM      Shoulder Exercises: Isometric Strengthening   Flexion  3X5"    Flexion Limitations  towel roll under elbow    Extension  3X5"    Extension Limitations  towel roll under elbow    External Rotation  3X5"    External Rotation Limitations  towel roll under elbow      Shoulder Exercises: Stretch   Corner Stretch  2 reps;30 seconds;Limitations    Corner Stretch Limitations  Door Stretch    Other Shoulder Stretches  open book streth using towel roll/bolster under spine running in vertial position allowing pectoralis stretch while performing push up motion using cane in both UE's. (bench press movement)             PT Education - 09/01/19 0948    Education Details  HEP, PT POC    Person(s) Educated  Patient    Methods  Explanation;Demonstration;Other (comment)    Comprehension  Verbalized understanding;Returned demonstration          PT Long Term Goals - 09/01/19 1001      PT LONG TERM GOAL #1   Title  Pt will be independent in his HEP and porgression.    Time  6    Period  Weeks  Status  New    Target Date  10/13/19      PT LONG TERM GOAL #2   Title  pt will improve his FOTO score from 34% limitation to </= 24% limitation.    Time  6     Period  Weeks    Status  New    Target Date  10/13/19      PT LONG TERM GOAL #3   Title  Pt will be able to perform pushups >/ 10 reps with no pain.    Baseline  pt reporting> 5/10 pain with push ups    Time  6    Period  Weeks    Status  New    Target Date  10/13/19      PT LONG TERM GOAL #4   Title  Pt will be able to run without pain in L shoulder.    Baseline  running causes pain    Time  6    Period  Weeks    Status  New    Target Date  10/13/19      PT LONG TERM GOAL #5   Title  Pt will improve his L shoulder strength to 5/5 in order to improve functional mobility.    Baseline  4+/5    Time  6    Period  Weeks    Status  New    Target Date  10/13/19             Plan - 09/01/19 1610    Clinical Impression Statement  Pt is a 28 year old male who just completed basic training for [employer] in September where he began to experience L shoulder pain. Pt tender to palpation along superior and posterior capsule with increased pain with shoulder flexion/ER and ABD end range. Pt also with "popping" noted with extension and ER. Pt with tightness noted in L upper trap and pects. Pt was issued a HEP and instructed in painfree exercising. Skilled PT needed to progress pt toward his painfree PLOF.    Examination-Activity Limitations  Carry;Reach Overhead;Other    Examination-Participation Restrictions  Other    Stability/Clinical Decision Making  Stable/Uncomplicated    Clinical Decision Making  Low    Rehab Potential  Excellent    PT Frequency  2x / week    PT Treatment/Interventions  ADLs/Self Care Home Management;Electrical Stimulation;Iontophoresis /ml Dexamethasone;Moist Heat;Ultrasound;Cryotherapy;Therapeutic activities;Therapeutic exercise;Balance training;Neuromuscular re-education;Patient/family education;Manual techniques;Dry needling;Passive range of motion;Taping    PT Next Visit Plan  shoulder stretching, postural exercises, prone shoulder exercises, begin  gentle strengthening with theraband    PT Home Exercise Plan  Access Code: [email or web address] (cane flexion, cane ER, isometrics: ER/flexion/extension, door strech in 3 positions, thoracic extension with towel roll in vertical position    Consulted and Agree with Plan of Care  Patient       Patient will benefit from skilled therapeutic intervention in order to improve the following deficits and impairments:  Pain, Postural dysfunction, Decreased strength, Decreased range of motion, Decreased activity tolerance  Visit Diagnosis: Acute pain of left shoulder  Muscle weakness (generalized)     Problem List Patient Active Problem List   Diagnosis Date Noted  . Irritant rhinitis 08/23/2019  . Acute pain of left shoulder 08/23/2019  . 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 Closed fracture of fourth toe of left foot 12/09/2017  . Posttraumatic stress disorder 07/02/2017  . Lumbar degenerative disc disease 03/07/2017  . Male infertility 03/30/2015  . Ganglion  cyst of flexor tendon sheath of finger of right hand 03/07/2015  . Annual physical exam 03/29/2013  . Right carpal tunnel syndrome 03/29/2013    Oretha Caprice, PT 09/01/2019, 10:07 AM  [facility] [address] [place], [place], [address] Phone: [phone]   Fax:  [phone]  Name: Adam Adams MRN: [id number] Date of Birth: [birth year]

## 2019-09-08 ENCOUNTER — Encounter: Payer: BC Managed Care – PPO | Admitting: Physical Therapy

## 2019-09-13 ENCOUNTER — Other Ambulatory Visit: Payer: Self-pay

## 2019-09-13 ENCOUNTER — Ambulatory Visit: Payer: BC Managed Care – PPO | Admitting: Physical Therapy

## 2019-09-13 ENCOUNTER — Encounter: Payer: Self-pay | Admitting: Physical Therapy

## 2019-09-13 DIAGNOSIS — M6281 Muscle weakness (generalized): Secondary | ICD-10-CM | POA: Diagnosis not present

## 2019-09-13 DIAGNOSIS — M25512 Pain in left shoulder: Secondary | ICD-10-CM | POA: Diagnosis not present

## 2019-09-13 DIAGNOSIS — M25531 Pain in right wrist: Secondary | ICD-10-CM | POA: Diagnosis not present

## 2019-09-13 NOTE — Therapy (Signed)
[place], [place], [address] Phone: [phone]   Fax:  [phone]  Physical Therapy Treatment  Patient Details  Name: Adam Adams MRN: [id number] Date of Birth: Feb 04, 1991 Referring Provider (PT): Dr. Dianah Field   Encounter Date: 09/13/2019  PT End of Session - 09/13/19 1020    Visit Number  2    Number of Visits  13    Date for PT Re-Evaluation  10/27/19    PT Start Time  1019    PT Stop Time  1058    PT Time Calculation (min)  39 min    Activity Tolerance  Patient tolerated treatment well    Behavior During Therapy  [facility] for tasks assessed/performed       Past Medical History:  Diagnosis Date  . Male infertility 03/30/2015  . Right carpal tunnel syndrome 03/29/2013   Nerve conduction study shows right carpal tunnel syndrome without evidence of cervical radiculitis.     History reviewed. No pertinent surgical history.  There were no vitals filed for this visit.  Subjective Assessment - 09/13/19 1020    Subjective  Patient is still experiencing pain with pushing up through his left UE. He also feels popping with horizontal ADD.    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  No/denies         [facility] PT Assessment - 09/13/19 0001      Strength   Left Shoulder Flexion  5/5    Left Shoulder Extension  5/5    Left Shoulder ABduction  5/5    Left Shoulder Internal Rotation  5/5    Left Shoulder External Rotation  4+/5                   [facility] Adult PT Treatment/Exercise - 09/13/19 0001      Shoulder Exercises: Supine   Protraction  Left;15 reps;Weights    Protraction Weight (lbs)  5    External Rotation  AAROM;5 reps;Limitations    External Rotation Limitations  holding 3 seconds in painfree Rom    Flexion  AAROM;5 reps;Limitations    Flexion Limitations  holding 3  seconds in painfree ROM; no pain after DN      Shoulder Exercises: Standing   Protraction  Left;15 reps;Theraband    Theraband Level (Shoulder Protraction)  Level 2 (Red)    External Rotation  Left;15 reps;Theraband    Theraband Level (Shoulder External Rotation)  Level 2 (Red)    Internal Rotation  Left;15 reps;Theraband    Theraband Level (Shoulder Internal Rotation)  Level 2 (Red)    Flexion  Left;Theraband;15 reps    Theraband Level (Shoulder Flexion)  Level 2 (Red)    Other Standing Exercises  wall push ups x 3 some pain in left shoulder      Shoulder Exercises: Stretch   Corner Stretch  4 reps;30 seconds    Corner Stretch Limitations  door: OH and low 2 ea    External Rotation Stretch  2 reps;30 seconds   left with elbow at 90 deg   Wall Stretch - Flexion  2 reps;10 seconds    Other Shoulder Stretches  demonstrated without towel      Manual Therapy   Manual Therapy  Soft tissue mobilization    Manual therapy comments  skilled palpation and monitoring of soft tissue  during DN      Soft tissue mobilization  to left UT       Trigger Point Dry Needling - 09/13/19 0001    Consent Given?  Yes    Education Handout Provided  Yes    Muscles Treated Head and Neck  Upper trapezius    Dry Needling Comments  left    Upper Trapezius Response  Twitch reponse elicited;Palpable increased muscle length           PT Education - 09/13/19 1100    Education Details  HEP Progressed; DN education and aftercare    Person(s) Educated  Patient    Methods  Explanation;Demonstration;Handout    Comprehension  Verbalized understanding;Returned demonstration          PT Long Term Goals - 09/13/19 1331      PT LONG TERM GOAL #1   Title  Pt will be independent in his HEP and porgression.    Status  Partially Met      PT LONG TERM GOAL #2   Title  pt will improve his FOTO score from 34% limitation to </= 24% limitation.    Status  On-going      PT LONG TERM GOAL #3   Title  Pt will  be able to perform pushups >/ 10 reps with no pain.    Status  On-going      PT LONG TERM GOAL #4   Title  Pt will be able to run without pain in L shoulder.    Status  On-going      PT LONG TERM GOAL #5   Title  Pt will improve his L shoulder strength to 5/5 in order to improve functional mobility.    Status  Partially Met            Plan - 09/13/19 1327    Clinical Impression Statement  Patient presents today with reports of improvements in left shoulder pain after one week of HEP. He still has pain with push ups and pushing through LUE. MMT has improved. He tolerated band exercises without pain and responded well to DN in Left UT demonstrating improved left shoulder flex over that of right with no pain. HEP was modified as needed.    PT Frequency  2x / week    PT Treatment/Interventions  ADLs/Self Care Home Management;Electrical Stimulation;Iontophoresis 28m/ml Dexamethasone;Moist Heat;Ultrasound;Cryotherapy;Therapeutic activities;Therapeutic exercise;Balance training;Neuromuscular re-education;Patient/family education;Manual techniques;Dry needling;Passive range of motion;Taping    PT Next Visit Plan  shoulder stretching, postural exercises, prone shoulder exercises    PT Home Exercise Plan  Access Code: [email or web address]   Consulted and Agree with Plan of Care  Patient       Patient will benefit from skilled therapeutic intervention in order to improve the following deficits and impairments:  Pain, Postural dysfunction, Decreased strength, Decreased range of motion, Decreased activity tolerance  Visit Diagnosis: Acute pain of left shoulder  Muscle weakness (generalized)     Problem List Patient Active Problem List   Diagnosis Date Noted  . Irritant rhinitis 08/23/2019  . Acute pain of left shoulder 08/23/2019  . 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 Closed fracture of fourth toe of left foot 12/09/2017  . Posttraumatic stress disorder 07/02/2017  . Lumbar  degenerative disc disease 03/07/2017  . Male infertility 03/30/2015  . Ganglion cyst of flexor tendon sheath of finger of right hand 03/07/2015  . Annual physical exam 03/29/2013  . Right carpal tunnel syndrome 03/29/2013    JAlmyra Free  Efrem Pitstick PT 09/13/2019, 1:42 PM  [facility] [place], [place], [address] Phone: [phone]   Fax:  [phone]  Name: Adam Adams MRN: [id number] Date of Birth: 11-Oct-1990

## 2019-09-13 NOTE — Patient Instructions (Signed)
Access Code: [email or web address]  URL: [email or web address]  Date: 09/13/2019  Prepared by: Raynelle Fanning Ibeth Fahmy   Exercises Supine Shoulder Flexion Extension AAROM with Dowel - 15 reps - 2 sets - 2-3 seconds hold - 2x daily - 7x weekly Supine Shoulder External Rotation in 45 Degrees Abduction AAROM with Dowel - 15 reps - 2 sets - 2-3 seconds hold - 2x daily                            - 7x weekly Doorway Pec Stretch at 120 Degrees Abduction - 5 reps - 30 seconds hold - 2x daily - 7x weekly Corner Pec Minor Stretch - 5 reps - 3 sets - 30 seconds hold - 2x daily - 7x weekly Supine Chest Stretch on Foam Roll - 10 reps - 2 sets - 2-3 seconds hold - 2x daily - 7x weekly Standing Isometric Shoulder Internal Rotation with Towel Roll at Doorway - 10 reps - 5 seconds hold - 2x daily - 7x weekly Standing Isometric Shoulder Extension with Doorway - Arm Bent - 10 reps - 5 seconds hold - 2x daily - 7x weekly Isometric Shoulder Abduction with Ball at Wall - 10 reps - 5 seconds hold - 2x daily - 7x weekly Standing Shoulder External Rotation Stretch in Doorway - 3 reps - 1 sets - 30-60 seconds hold - 3x daily - 7x weekly Alternating Punch with Resistance - 10 reps - 3 sets - 1x daily - 7x weekly Shoulder Internal Rotation with Resistance - 10 reps - 3 sets - 1x daily - 7x weekly Standing Shoulder Flexion with Resistance - 10 reps - 3 sets - 1x daily - 7x weekly Shoulder External Rotation - 10 reps - 3 sets - 1x daily - 7x weekly                  Patient Education Trigger Point Dry Needling

## 2019-09-15 ENCOUNTER — Encounter: Payer: BC Managed Care – PPO | Admitting: Physical Therapy

## 2019-09-21 ENCOUNTER — Encounter: Payer: Self-pay | Admitting: Physical Therapy

## 2019-09-21 ENCOUNTER — Ambulatory Visit: Payer: BC Managed Care – PPO | Admitting: Physical Therapy

## 2019-09-21 ENCOUNTER — Other Ambulatory Visit: Payer: Self-pay

## 2019-09-21 DIAGNOSIS — M25512 Pain in left shoulder: Secondary | ICD-10-CM

## 2019-09-21 DIAGNOSIS — M6281 Muscle weakness (generalized): Secondary | ICD-10-CM

## 2019-09-21 NOTE — Therapy (Signed)
[place], [place], [address] Phone: [phone]   Fax:  [phone]  Physical Therapy Treatment  Patient Details  Name: Adam Adams MRN: [id number] Date of Birth: [birth year] Referring Provider (PT): Dr. Dianah Field   Encounter Date: 09/21/2019  PT End of Session - 09/21/19 0804    Visit Number  3    Number of Visits  13    Date for PT Re-Evaluation  10/27/19    PT Start Time  0804    PT Stop Time  0846    PT Time Calculation (min)  42 min    Activity Tolerance  Patient tolerated treatment well    Behavior During Therapy  [facility] for tasks assessed/performed       Past Medical History:  Diagnosis Date  . Male infertility 03/30/2015  . Right carpal tunnel syndrome 03/29/2013   Nerve conduction study shows right carpal tunnel syndrome without evidence of cervical radiculitis.     History reviewed. No pertinent surgical history.  There were no vitals filed for this visit.  Subjective Assessment - 09/21/19 0804    Subjective  Patient states shoulder is sore and had more popping this weekend. Still experiencing 9/10 pain in shoulder when pushing through shoulder    Patient Stated Goals  Stop hurting    Currently in Pain?  No/denies         [facility] PT Assessment - 09/21/19 0001      Strength   Left Shoulder ABduction  4+/5    Left Shoulder External Rotation  4+/5                   [facility] Adult PT Treatment/Exercise - 09/21/19 0001      Shoulder Exercises: Supine   Protraction  Left;15 reps;Weights    Protraction Weight (lbs)  6      Shoulder Exercises: Seated   Retraction  Both;15 reps;Weights    Retraction Weight (lbs)  2    Retraction Limitations  with ER (robber); pain stops pt on left      Shoulder Exercises: Sidelying   External Rotation  Left;20 reps;Weights    External Rotation Weight (lbs)  2   1st set with 1#   ABduction Limitations  clunking    Other Sidelying  Exercises  empty can 2x10 2#      Shoulder Exercises: Standing   External Rotation  15 reps;Theraband;Both    Theraband Level (Shoulder External Rotation)  Level 2 (Red)    Extension  Both;15 reps;Theraband    Theraband Level (Shoulder Extension)  Level 2 (Red)    Row  Both;15 reps;Theraband    Theraband Level (Shoulder Row)  Level 2 (Red)    Diagonals  Left;15 reps;Theraband    Theraband Level (Shoulder Diagonals)  Level 2 (Red)    Diagonals Limitations  D1 ext, D2 ext; Initial D2 flex/ext painful without resistance x 5    Other Standing Exercises  wall push up and counter push up x 1 no pain reported      Manual Therapy   Manual Therapy  Joint mobilization;Passive ROM    Manual therapy comments  palpation for tenderness mild at supraspinatus insertion; not tender at pec minor attahment to scapula    Joint Mobilization  to left [place] joint all planes for assessment; post/ant WNL with no pain; inferior soft end feel    Passive ROM  for assessment: full ER; IR limited without post glide applied  PT Long Term Goals - 09/13/19 1331      PT LONG TERM GOAL #1   Title  Pt will be independent in his HEP and porgression.    Status  Partially Met      PT LONG TERM GOAL #2   Title  pt will improve his FOTO score from 34% limitation to </= 24% limitation.    Status  On-going      PT LONG TERM GOAL #3   Title  Pt will be able to perform pushups >/ 10 reps with no pain.    Status  On-going      PT LONG TERM GOAL #4   Title  Pt will be able to run without pain in L shoulder.    Status  On-going      PT LONG TERM GOAL #5   Title  Pt will improve his L shoulder strength to 5/5 in order to improve functional mobility.    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 He reports DN helped last visit.  He can perform wall  push up and push up at counter without pain and only mild pain with full push up. He had significant GH clunking with SDLY ABD indicating poor supraspinatus performance. He also had signifcant crepitus and some pain with post T and Ys. He tolerated band exercises without pain and HEP was progressed. ABD was weaker today with MMT. Patient states he is going to start working out to prepare for April physical. PT advised avoiding pectoral strengthening and anything that causes pain for now.    PT Frequency  2x / week    PT Treatment/Interventions  ADLs/Self Care Home Management;Electrical Stimulation;Iontophoresis 4mg/ml Dexamethasone;Moist Heat;Ultrasound;Cryotherapy;Therapeutic activities;Therapeutic exercise;Balance training;Neuromuscular re-education;Patient/family education;Manual techniques;Dry needling;Passive range of motion;Taping    PT Next Visit Plan  supraspinatus strengthening, shoulder stretching, postural strengthening    PT Home Exercise Plan  Access Code: [email or web address]    Consulted and Agree with Plan of Care  Patient       Patient will benefit from skilled therapeutic intervention in order to improve the following deficits and impairments:  Pain, Postural dysfunction, Decreased strength, Decreased range of motion, Decreased activity tolerance  Visit Diagnosis: Acute pain of left shoulder  Muscle weakness (generalized)     Problem List Patient Active Problem List   Diagnosis Date Noted  . Irritant rhinitis 08/23/2019  . Acute pain of left shoulder 08/23/2019  . 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 Closed fracture of fourth toe of left foot 12/09/2017  . Posttraumatic stress disorder 07/02/2017  . Lumbar degenerative disc disease 03/07/2017  . Male infertility 03/30/2015  . Ganglion cyst of flexor tendon sheath of finger of right hand 03/07/2015  . Annual physical exam 03/29/2013  . Right carpal tunnel syndrome 03/29/2013    Julie Riddles PT 09/21/2019,  10:10 AM  [facility] [address] [place], [place], [address] Phone: [phone]   Fax:  [phone]  Name: Adam Adams MRN: [id number] Date of Birth: 10/27/1990   

## 2019-09-29 ENCOUNTER — Ambulatory Visit: Payer: BC Managed Care – PPO | Admitting: Physical Therapy

## 2019-09-29 ENCOUNTER — Other Ambulatory Visit: Payer: Self-pay

## 2019-09-29 ENCOUNTER — Encounter: Payer: Self-pay | Admitting: Physical Therapy

## 2019-09-29 DIAGNOSIS — M6281 Muscle weakness (generalized): Secondary | ICD-10-CM | POA: Diagnosis not present

## 2019-09-29 DIAGNOSIS — M25512 Pain in left shoulder: Secondary | ICD-10-CM | POA: Diagnosis not present

## 2019-09-29 NOTE — Therapy (Signed)
[place], [place], [address] Phone: [phone]   Fax:  [phone]  Physical Therapy Treatment  Patient Details  Name: Adam Adams MRN: [id number] Date of Birth: [birth year] Referring Provider (PT): Dr. Dianah Field   Encounter Date: 09/29/2019  PT End of Session - 09/29/19 0936    Visit Number  4    Number of Visits  13    Date for PT Re-Evaluation  10/27/19    PT Start Time  0936    PT Stop Time  1015    PT Time Calculation (min)  39 min    Activity Tolerance  Patient tolerated treatment well    Behavior During Therapy  [facility] for tasks assessed/performed       Past Medical History:  Diagnosis Date  . Male infertility 03/30/2015  . Right carpal tunnel syndrome 03/29/2013   Nerve conduction study shows right carpal tunnel syndrome without evidence of cervical radiculitis.     History reviewed. No pertinent surgical history.  There were no vitals filed for this visit.  Subjective Assessment - 09/29/19 0937    Subjective  Patient reporting improvements overall with everyday activities. He still has some pain pushing up 7/10. He started working out again and it is going well for the most part. He is doing push ups using dumbbells. It hurts more with flat palm.    Currently in Pain?  No/denies                       [facility] Adult PT Treatment/Exercise - 09/29/19 0001      Self-Care   Self-Care  Other Self-Care Comments    Other Self-Care Comments   MRR with ball to lumbar and post shoulder      Shoulder Exercises: Supine   Protraction  Left;Weights;20 reps   1st set with 5#   Protraction Weight (lbs)  10      Shoulder Exercises: Prone   Horizontal ABduction 1  5 reps    Horizontal ABduction 1 Limitations  crepitis and some discomfort; also tried on physio ball; did in standing with band instead    Other Prone Exercises  push up on knees x 3      Shoulder Exercises:  Standing   Horizontal ABduction  Both;20 reps;Theraband    Theraband Level (Shoulder Horizontal ABduction)  Level 2 (Red)    External Rotation  Both;20 reps;Theraband    Theraband Level (Shoulder External Rotation)  Level 2 (Red)    Flexion  Left;20 reps;Theraband    Theraband Level (Shoulder Flexion)  Level 2 (Red)    Flexion Limitations  cues for posture    Diagonals  Left;20 reps    Theraband Level (Shoulder Diagonals)  Level 2 (Red)    Diagonals Limitations  D2 ext 10 reps      Shoulder Exercises: Stretch   Other Shoulder Stretches  left QL stretch supine x 1 min; Mckenzie lateral glide 2x 10 sec      Manual Therapy   Manual therapy comments  neg clunk test Left             PT Education - 09/29/19 1305    Education Details  HEP progressed; self care: MFR with ball for lumbar and post shoulder    Person(s) Educated  Patient    Methods  Explanation;Demonstration;Handout    Comprehension  Verbalized understanding;Returned demonstration          PT Long Term Goals -  09/29/19 1308      PT LONG TERM GOAL #1   Title  Pt will be independent in his HEP and progression.    Status  Partially Met      PT LONG TERM GOAL #3   Title  Pt will be able to perform pushups >/ 10 reps with no pain.    Baseline  able to do 25 push ups without pain    Status  Achieved      PT LONG TERM GOAL #5   Title  Pt will improve his L shoulder strength to 5/5 in order to improve functional mobility.    Status  Partially Met            Plan - 09/29/19 1301    Clinical Impression Statement  Patient reporting less pain with pushing straight down through LUE. He was able to perform military push up on knees without pain. He tolerated TE well today with mild complaints of pain with resisted flexion and D2 extension. Patient using pain as a guide with HEP as pain is inconsistent with exercises. He had a negative clunk test today but still has crepitis with certain TE. Also inconsistent.  Patient demos depressed left shoulder with TE and left QL was tight. PT issued lateral mckenzie glide and QL stretch for this as well as self MFR with ball.    PT Frequency  2x / week    PT Treatment/Interventions  ADLs/Self Care Home Management;Electrical Stimulation;Iontophoresis 109m/ml Dexamethasone;Moist Heat;Ultrasound;Cryotherapy;Therapeutic activities;Therapeutic exercise;Balance training;Neuromuscular re-education;Patient/family education;Manual techniques;Dry needling;Passive range of motion;Taping    PT Next Visit Plan  supraspinatus strengthening, shoulder stretching, postural strengthening    PT Home Exercise Plan  Access Code: [email or web address]   Consulted and Agree with Plan of Care  Patient       Patient will benefit from skilled therapeutic intervention in order to improve the following deficits and impairments:  Pain, Postural dysfunction, Decreased strength, Decreased range of motion, Decreased activity tolerance  Visit Diagnosis: Acute pain of left shoulder  Muscle weakness (generalized)     Problem List Patient Active Problem List   Diagnosis Date Noted  . Irritant rhinitis 08/23/2019  . Acute pain of left shoulder 08/23/2019  . 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 Closed fracture of fourth toe of left foot 12/09/2017  . Posttraumatic stress disorder 07/02/2017  . Lumbar degenerative disc disease 03/07/2017  . Male infertility 03/30/2015  . Ganglion cyst of flexor tendon sheath of finger of right hand 03/07/2015  . Annual physical exam 03/29/2013  . Right carpal tunnel syndrome 03/29/2013    JMadelyn FlavorsPT 09/29/2019, 1:12 PM  [facility] [address]Phone: [phone]  Fax:  [phone] Name: JDarreon LutesMRN: [id number] of Birth: [birth year]

## 2019-10-04 ENCOUNTER — Ambulatory Visit: Payer: BC Managed Care – PPO | Admitting: Sports Medicine

## 2019-10-06 ENCOUNTER — Encounter: Payer: BC Managed Care – PPO | Admitting: Physical Therapy

## 2019-10-11 ENCOUNTER — Encounter: Payer: Self-pay | Admitting: Sports Medicine

## 2019-10-11 ENCOUNTER — Ambulatory Visit: Payer: BC Managed Care – PPO | Admitting: Sports Medicine

## 2019-10-11 ENCOUNTER — Other Ambulatory Visit: Payer: Self-pay

## 2019-10-11 DIAGNOSIS — M25512 Pain in left shoulder: Secondary | ICD-10-CM

## 2019-10-11 DIAGNOSIS — M25331 Other instability, right wrist: Secondary | ICD-10-CM

## 2019-10-11 NOTE — Assessment & Plan Note (Signed)
There is a very small scapholunate injury that was seen on MR arthrogram from the summertime. He did well with an injection, he has also seen Dr. Amanda Pea with hand surgery who suggests the scapholunate tear is so small that is not worth operative intervention at this point. We can continue injections here and there, and and some hand rehab, return to see me as needed for this.

## 2019-10-11 NOTE — Assessment & Plan Note (Signed)
Adam Adams returns, he is finished about 6 weeks of physical therapy, symptoms are improving albeit slowly. Symptoms were predominantly impingement and labral related. Because he is continuing to improve return to do another 2 weeks of physical therapy, he will return to see me on a Monday and we can consider MR arthrogram if no better.

## 2019-10-11 NOTE — Progress Notes (Signed)
    Procedures performed today:    None.  Independent interpretation of tests performed by another provider:   None.  Impression and Recommendations:    Acute pain of left shoulder Dontavian returns, he is finished about 6 weeks of physical therapy, symptoms are improving albeit slowly. Symptoms were predominantly impingement and labral related. Because he is continuing to improve return to do another 2 weeks of physical therapy, he will return to see me on a Monday and we can consider MR arthrogram if no better.  Scapholunate dissociation of right wrist There is a very small scapholunate injury that was seen on MR arthrogram from the summertime. He did well with an injection, he has also seen Dr. Amanda Pea with hand surgery who suggests the scapholunate tear is so small that is not worth operative intervention at this point. We can continue injections here and there, and and some hand rehab, return to see me as needed for this.    ___________________________________________ [name]. Benjamin Stain, M.D., ABFM., CAQSM. Primary Care and Sports Medicine [facility]  Adjunct Instructor of Family Medicine  [employer]

## 2019-10-13 ENCOUNTER — Ambulatory Visit: Payer: BC Managed Care – PPO | Admitting: Physical Therapy

## 2019-10-13 ENCOUNTER — Encounter: Payer: Self-pay | Admitting: Physical Therapy

## 2019-10-13 ENCOUNTER — Other Ambulatory Visit: Payer: Self-pay

## 2019-10-13 DIAGNOSIS — M6281 Muscle weakness (generalized): Secondary | ICD-10-CM | POA: Diagnosis not present

## 2019-10-13 DIAGNOSIS — M25512 Pain in left shoulder: Secondary | ICD-10-CM | POA: Diagnosis not present

## 2019-10-13 NOTE — Therapy (Signed)
[place], [place], [address] Phone: [phone]   Fax:  [phone]  Physical Therapy Treatment  Patient Details  Name: Adam Adams MRN: [id number] Date of Birth: [birth year] Referring Provider (PT): Dr. Dianah Field   Encounter Date: 10/13/2019  PT End of Session - 10/13/19 0801    Visit Number  5    Number of Visits  13    Date for PT Re-Evaluation  10/27/19    PT Start Time  0801    PT Stop Time  4163    PT Time Calculation (min)  43 min    Activity Tolerance  Patient tolerated treatment well;Patient limited by pain    Behavior During Therapy  [facility] for tasks assessed/performed       Past Medical History:  Diagnosis Date  . Male infertility 03/30/2015  . Right carpal tunnel syndrome 03/29/2013   Nerve conduction study shows right carpal tunnel syndrome without evidence of cervical radiculitis.     History reviewed. No pertinent surgical history.  There were no vitals filed for this visit.  Subjective Assessment - 10/13/19 0802    Subjective  Patient saw Dr. Darene Lamer and he wants him to continue PT for 2 more weeks. Patient will likely need an MRI. Patient reports shoulder will feel better and then he starts doing ADLS and pain returns. He is compliant with exercises that do not irritate the shouuder.    Patient Stated Goals  Stop hurting    Currently in Pain?  No/denies   gettng up to 8/10 and then sharp and immediately goes away                      [facility] Adult PT Treatment/Exercise - 10/13/19 0001      Shoulder Exercises: Supine   Protraction  Left;20 reps;Weights    Protraction Weight (lbs)  10    Other Supine Exercises  on 1/2 foam roll Y position low trap scap depression x 20; also in standing x 5      Shoulder Exercises: Prone   Retraction  20 reps    Horizontal ABduction 1  Left;15 reps    Horizontal ABduction 1 Weight (lbs)  3    Horizontal ABduction 1 Limitations   also on ball with ball drop x 10 causes popping in post shoulder    Other Prone Exercises  Y lifts 2x10, with head lift 2x10 one set on ball; diamond lifts x 10, goal post x 10      Shoulder Exercises: Sidelying   External Rotation  Left;20 reps;Weights    External Rotation Weight (lbs)  3    Other Sidelying Exercises  empty can 3# x 20      Shoulder Exercises: ROM/Strengthening   Plank  5 reps    Plank Limitations  with shoulder taps x 5 ea. Attempted wt pull through but too painful.    Modified Plank  5 reps    Modified Plank Limitations  with wt pull through; painful after  reps ea.     Side Plank  2 reps    Side Plank Limitations  left elbow on swiss ball with modified side plank. Able to stabilize but pain increased; also did horizontal ABD with 3# in mod side plank x 10; shoulder visible post gliding into glenoid at end range horizontal ABD      Shoulder Exercises: Stretch   Other Shoulder Stretches  1/2 foam roller T and Y stretch x  1 min ea bil    Other Shoulder Stretches  elbow press x 10 with hands behind head              PT Education - 10/13/19 1414    Education Details  HEP new prone exercises added    Person(s) Educated  Patient    Methods  Explanation;Demonstration;Handout    Comprehension  Verbalized understanding;Returned demonstration          PT Long Term Goals - 09/29/19 1308      PT LONG TERM GOAL #1   Title  Pt will be independent in his HEP and progression.    Status  Partially Met      PT LONG TERM GOAL #3   Title  Pt will be able to perform pushups >/ 10 reps with no pain.    Baseline  able to do 25 push ups without pain    Status  Achieved      PT LONG TERM GOAL #5   Title  Pt will improve his L shoulder strength to 5/5 in order to improve functional mobility.    Status  Partially Met            Plan - 10/13/19 1415    Clinical Impression Statement  We worked on challenging serrratus anterior and posterior scapular muscles.  Patient experiencing instability with certain exercises and pain with many of the exercises involving weightbearing through the LUE. He feels relief with prone T's, Y's and diamonds and these were added to HEP. He also felt relief with supine pec stretching on 1/2 foam roller. Plan to see pt one more visit, then likely place on hold until further testing completed. LTGs are ongoing.    PT Frequency  2x / week    PT Treatment/Interventions  ADLs/Self Care Home Management;Electrical Stimulation;Iontophoresis 14m/ml Dexamethasone;Moist Heat;Ultrasound;Cryotherapy;Therapeutic activities;Therapeutic exercise;Balance training;Neuromuscular re-education;Patient/family education;Manual techniques;Dry needling;Passive range of motion;Taping    PT Next Visit Plan  Complete FOTO; Assess goals and HEP for pain, modify as needed. Discuss hold vs. D/C with pt.    PT Home Exercise Plan  Access Code: [email or web address]   Consulted and Agree with Plan of Care  Patient       Patient will benefit from skilled therapeutic intervention in order to improve the following deficits and impairments:  Pain, Postural dysfunction, Decreased strength, Decreased range of motion, Decreased activity tolerance  Visit Diagnosis: Acute pain of left shoulder  Muscle weakness (generalized)     Problem List Patient Active Problem List   Diagnosis Date Noted  . Irritant rhinitis 08/23/2019  . Acute pain of left shoulder 08/23/2019  . 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 Closed fracture of fourth toe of left foot 12/09/2017  . Posttraumatic stress disorder 07/02/2017  . Lumbar degenerative disc disease 03/07/2017  . Male infertility 03/30/2015  . Ganglion cyst of flexor tendon sheath of finger of right hand 03/07/2015  . Annual physical exam 03/29/2013  . Right carpal tunnel syndrome 03/29/2013    JMadelyn FlavorsPT 10/13/2019, 2:42 PM  [facility] [address]Phone: [phone]  Fax:  [phone] Name: Adam VonadaMRN: [id number] of Birth: 9April 24, 1992

## 2019-10-13 NOTE — Patient Instructions (Addendum)
Access Code: [email or web address]  URL: [email or web address]  Date: 10/13/2019  Prepared by: Raynelle Fanning Aleyah Balik   Exercises Supine Shoulder Flexion Extension AAROM with Dowel - 15 reps - 2 sets - 2-3 seconds hold - 2x daily - 7x weekly Supine Shoulder External Rotation in 45 Degrees Abduction AAROM with Dowel - 15 reps - 2 sets - 2-3 seconds hold - 2x daily                            - 7x weekly Doorway Pec Stretch at 120 Degrees Abduction - 5 reps - 30 seconds hold - 2x daily - 7x weekly Corner Pec Minor Stretch - 5 reps - 3 sets - 30 seconds hold - 2x daily - 7x weekly Supine Chest Stretch on Foam Roll - 10 reps - 2 sets - 2-3 seconds hold - 2x daily - 7x weekly Standing Isometric Shoulder Internal Rotation with Towel Roll at Doorway - 10 reps - 5 seconds hold - 2x daily - 7x weekly Standing Isometric Shoulder Extension with Doorway - Arm Bent - 10 reps - 5 seconds hold - 2x daily - 7x weekly Isometric Shoulder Abduction with Ball at [employer] - 10 reps - 5 seconds hold - 2x daily - 7x weekly Standing Shoulder External Rotation Stretch in Doorway - 3 reps - 1 sets - 30-60 seconds hold - 3x daily - 7x weekly Alternating Punch with Resistance - 10 reps - 3 sets - 1x daily - 7x weekly Shoulder Internal Rotation with Resistance - 10 reps - 3 sets - 1x daily - 7x weekly Standing Shoulder Flexion with Resistance - 10 reps - 3 sets - 1x daily - 7x weekly Shoulder External Rotation - 10 reps - 3 sets - 1x daily - 7x weekly                  Standing Shoulder Row with Anchored Resistance - 10 reps - 3 sets - 1x daily - 7x weekly Shoulder extension with resistance - Neutral - 10 reps - 3 sets - 1x daily - 7x weekly Shoulder W - External Rotation with Resistance - 10 reps - 3 sets - 1x daily - 7x weekly Shoulder PNF D2 with Resistance - 10 reps - 3 sets - 1x daily - 7x weekly Standing Single Arm Shoulder PNF D1 Extension with Anchored Resistance - 10 reps - 3 sets - 1x daily - 7x weekly Standing Shoulder  Horizontal Abduction with Resistance - 10 reps - 3 sets - 1x daily - 7x weekly Lateral Shift Correction at Wall - 10 reps - 2 sets - 10 sec hold - 3x daily - 7x weekly Prone Shoulder Horizontal Abduction with Thumbs Up - 15 reps - 1 sets - 1x daily - 7x weekly Prone Scapular Retraction Y - 15 reps - 1 sets - 1x daily - 7x weekly Prone Scapular Retraction in Abduction - 15 reps - 1 sets - 1x daily - 7x weekly Supine Chest Stretch on Foam Roll - 1 reps - 1 sets - sec hold - 1x daily - 7x weekly Supine Chest Stretch with Elbows Bent - 10 reps - 2 sets - sec hold - 1x daily - 7x weekly Patient Education Trigger Point Dry Needling

## 2019-10-20 ENCOUNTER — Other Ambulatory Visit: Payer: Self-pay

## 2019-10-20 ENCOUNTER — Encounter: Payer: Self-pay | Admitting: Physical Therapy

## 2019-10-20 ENCOUNTER — Ambulatory Visit: Payer: BC Managed Care – PPO | Admitting: Physical Therapy

## 2019-10-20 DIAGNOSIS — M6281 Muscle weakness (generalized): Secondary | ICD-10-CM | POA: Diagnosis not present

## 2019-10-20 DIAGNOSIS — M25512 Pain in left shoulder: Secondary | ICD-10-CM | POA: Diagnosis not present

## 2019-10-20 NOTE — Therapy (Signed)
[place], [place], [address] Phone: [phone]   Fax:  [phone]  Physical Therapy Treatment  Patient Details  Name: Adam Adams MRN: [id number] Date of Birth: [birth year] Referring Provider (PT): Dr. Dianah Field   Encounter Date: 10/20/2019  PT End of Session - 10/20/19 0802    Visit Number  6    Number of Visits  13    Date for PT Re-Evaluation  10/27/19    PT Start Time  0802    PT Stop Time  0843    PT Time Calculation (min)  41 min    Activity Tolerance  Patient tolerated treatment well;Patient limited by pain    Behavior During Therapy  [facility] for tasks assessed/performed       Past Medical History:  Diagnosis Date  . Male infertility 03/30/2015  . Right carpal tunnel syndrome 03/29/2013   Nerve conduction study shows right carpal tunnel syndrome without evidence of cervical radiculitis.     History reviewed. No pertinent surgical history.  There were no vitals filed for this visit.  Subjective Assessment - 10/20/19 0804    Subjective  It's a little bit better. I can push myself up easier. No pain with push ups or when arms are in front. But when behind and pushing it hurts. MRI scheduled for 10/25/19.    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    Patient Stated Goals  Stop hurting         [facility] PT Assessment - 10/20/19 0001      Strength   Left Shoulder ABduction  --   5-/5   Left Shoulder External Rotation  5/5                   [facility] Adult PT Treatment/Exercise - 10/20/19 0001      Shoulder Exercises: Supine   Protraction  Left;20 reps;Weights    Protraction Weight (lbs)  10    Other Supine Exercises  on 1/2 foam roll Y position low trap scap depression x 20; also in standing x 5      Shoulder Exercises: Prone   Horizontal ABduction 1  Both;20 reps;10 reps    Horizontal ABduction 1 Weight (lbs)  3    Other Prone  Exercises  Y lifts 2x10 2nd set with head lift  diamond lifts 2 x 10, goal post x 20      Shoulder Exercises: Sidelying   External Rotation  Left;20 reps;Weights    External Rotation Weight (lbs)  3    ABduction  Left;20 reps;Weights    ABduction Weight (lbs)  3    ABduction Limitations  shoulder pops at about 90 deg    Other Sidelying Exercises  empty can 3# x 20      Shoulder Exercises: ROM/Strengthening   Other ROM/Strengthening Exercises  pulleys 3 plates Lt UT shrug 8T41; engaged Rt UT as well for better Lt recruitment    Other ROM/Strengthening Exercises  standing with dowel arms 30 deg out shrugs 5 sec hold x 10 in mirror for visual feedback to Lt UT      Shoulder Exercises: Stretch   Other Shoulder Stretches  1/2 foam roller T and Y stretch x 1 min ea bil    Other Shoulder Stretches  Hands behind head on 1/2 foam for stretch x 1 min; elbow press x 10 with hands behind head  PT Education - 10/20/19 1118    Education Details  HEP shrugs added for gym program    Person(s) Educated  Patient    Methods  Explanation;Demonstration;Handout   pt uses app   Comprehension  Verbalized understanding          PT Long Term Goals - 10/20/19 0807      PT LONG TERM GOAL #1   Title  Pt will be independent in his HEP and progression.    Status  Achieved      PT LONG TERM GOAL #2   Title  pt will improve his FOTO score from 34% limitation to </= 24% limitation.    Baseline  22% limited    Status  Achieved      PT LONG TERM GOAL #3   Title  Pt will be able to perform pushups >/ 10 reps with no pain.    Status  Achieved      PT LONG TERM GOAL #4   Title  Pt will be able to run without pain in L shoulder.    Baseline  feels it about 6/10 with shoulder extension when pushing it; jogging is okay    Status  On-going      PT LONG TERM GOAL #5   Title  Pt will improve his L shoulder strength to 5/5 in order to improve functional mobility.    Baseline  5-/5 for left  shoulder ABD, else 5/5    Status  Partially Met            Plan - 10/20/19 1119    Clinical Impression Statement  Patient reporting decreased pain and increased function since last visit. His MRI is scheduled for 10/25/19. He still has a small bit of weakness in ABD with MMT and continues to have instability above 90 deg ABD. PT stressed keeping chest stretched out and keeping post shoulder muscles strong to help stabilize joint. He has met all LTGs except 5/5 strength and pain with running. He has pain when he fully extends his arm when running hard.    PT Frequency  2x / week    PT Treatment/Interventions  ADLs/Self Care Home Management;Electrical Stimulation;Iontophoresis 43m/ml Dexamethasone;Moist Heat;Ultrasound;Cryotherapy;Therapeutic activities;Therapeutic exercise;Balance training;Neuromuscular re-education;Patient/family education;Manual techniques;Dry needling;Passive range of motion;Taping    PT Next Visit Plan  Continue 1x/wk unless d/c'd by MD.    PT Home Exercise Plan  Access Code: [email or web address]   Consulted and Agree with Plan of Care  Patient       Patient will benefit from skilled therapeutic intervention in order to improve the following deficits and impairments:  Pain, Postural dysfunction, Decreased strength, Decreased range of motion, Decreased activity tolerance  Visit Diagnosis: Acute pain of left shoulder  Muscle weakness (generalized)     Problem List Patient Active Problem List   Diagnosis Date Noted  . Irritant rhinitis 08/23/2019  . Acute pain of left shoulder 08/23/2019  . 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 Closed fracture of fourth toe of left foot 12/09/2017  . Posttraumatic stress disorder 07/02/2017  . Lumbar degenerative disc disease 03/07/2017  . Male infertility 03/30/2015  . Ganglion cyst of flexor tendon sheath of finger of right hand 03/07/2015  . Annual physical exam 03/29/2013  . Right carpal tunnel syndrome 03/29/2013     JMadelyn FlavorsPT 10/20/2019, 11:27 AM  [facility] [address]Phone: [phone]  Fax:  [phone] Name: Adam  Adams MRN: [id number] Date of Birth: 1990-10-03

## 2019-10-25 ENCOUNTER — Ambulatory Visit: Payer: BC Managed Care – PPO | Admitting: Sports Medicine

## 2019-10-25 ENCOUNTER — Encounter: Payer: Self-pay | Admitting: Sports Medicine

## 2019-10-25 ENCOUNTER — Other Ambulatory Visit: Payer: Self-pay

## 2019-10-25 DIAGNOSIS — L2082 Flexural eczema: Secondary | ICD-10-CM

## 2019-10-25 DIAGNOSIS — M25512 Pain in left shoulder: Secondary | ICD-10-CM

## 2019-10-25 MED ORDER — MELOXICAM 15 MG PO TABS: ORAL_TABLET | ORAL | 3 refills | Status: DC

## 2019-10-25 MED ORDER — TRIAMCINOLONE ACETONIDE 0.1 % EX OINT: 1.0000 "application " | TOPICAL_OINTMENT | Freq: Two times a day (BID) | CUTANEOUS | 6 refills | Status: DC

## 2019-10-25 NOTE — Progress Notes (Signed)
    Procedures performed today:    None.  Independent interpretation of tests performed by another provider:   None.  Impression and Recommendations:    Acute pain of left shoulder Adam Adams returns, he has done physical therapy, had x-rays, and has failed greater than 6 weeks of physician directed conservative measures so we will proceed with a MRI arthrogram of his left shoulder. Symptoms were both labral and impingement related with positive Neer sign, O'Brien's test, crank test, Hawkins test. If he is placed in a slot today I can double book him for the arthrogram injection.  Flexural eczema Mild scaly rash on the right anterior elbow, consistent with flexural eczema, this occurred in the past. Adding topical triamcinolone.    ___________________________________________ [name]. Benjamin Stain, M.D., ABFM., CAQSM. Primary Care and Sports Medicine [facility]  Adjunct Instructor of Family Medicine  [employer]

## 2019-10-25 NOTE — Assessment & Plan Note (Addendum)
Adam Adams returns, he has done physical therapy, had x-rays, and has failed greater than 6 weeks of physician directed conservative measures so we will proceed with a MRI arthrogram of his left shoulder. Symptoms were both labral and impingement related with positive Neer sign, O'Brien's test, crank test, Hawkins test. If he is placed in a slot today I can double book him for the arthrogram injection.

## 2019-10-25 NOTE — Assessment & Plan Note (Signed)
Mild scaly rash on the right anterior elbow, consistent with flexural eczema, this occurred in the past. Adding topical triamcinolone.

## 2019-10-27 ENCOUNTER — Ambulatory Visit: Payer: BC Managed Care – PPO | Admitting: Physical Therapy

## 2019-10-27 ENCOUNTER — Encounter: Payer: Self-pay | Admitting: Physical Therapy

## 2019-10-27 ENCOUNTER — Other Ambulatory Visit: Payer: Self-pay

## 2019-10-27 DIAGNOSIS — M6281 Muscle weakness (generalized): Secondary | ICD-10-CM | POA: Diagnosis not present

## 2019-10-27 DIAGNOSIS — M25512 Pain in left shoulder: Secondary | ICD-10-CM

## 2019-10-27 NOTE — Therapy (Addendum)
[place], [place], [address] Phone: [phone]   Fax:  [phone]  Physical Therapy Treatment and Discharge Summary  Patient Details  Name: Adam Adams MRN: [id number] Date of Birth: 12-Apr-1991 Referring Provider (PT): Dr. Dianah Field   Encounter Date: 10/27/2019  PT End of Session - 10/27/19 0804    Visit Number  7    Number of Visits  13    Date for PT Re-Evaluation  12/08/19    PT Start Time  0802    PT Stop Time  0843    PT Time Calculation (min)  41 min    Activity Tolerance  Patient tolerated treatment well;Patient limited by pain    Behavior During Therapy  [facility] for tasks assessed/performed       Past Medical History:  Diagnosis Date  . Male infertility 03/30/2015  . Right carpal tunnel syndrome 03/29/2013   Nerve conduction study shows right carpal tunnel syndrome without evidence of cervical radiculitis.     History reviewed. No pertinent surgical history.  There were no vitals filed for this visit.  Subjective Assessment - 10/27/19 0805    Subjective  Patient ran this morning with no pain. He was unable to do bench press this morning due to pain and had tingling over post shoulder.    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  No/denies         [facility] PT Assessment - 10/27/19 0001      Assessment   Medical Diagnosis  m25.512 L acute shoulder pain                   [facility] Adult PT Treatment/Exercise - 10/27/19 0001      Exercises   Exercises  Shoulder      Shoulder Exercises: Supine   Other Supine Exercises  bench press from table 4# x 10       Shoulder Exercises: Prone   Horizontal ABduction 1  Both;20 reps    Horizontal ABduction 1 Weight (lbs)  4      Shoulder Exercises: Sidelying   External Rotation  Left;20 reps;Weights    External Rotation Weight  (lbs)  4    External Rotation Limitations  no pain but instability noted in shoulder    ABduction Limitations  shoulder pops at about 90 deg    Other Sidelying Exercises  empty can 4# x 20      Shoulder Exercises: Stretch   Corner Stretch  4 reps;30 seconds    Corner Stretch Limitations  in doorway: Yx 2, T, 90/90    External Rotation Stretch  2 reps;30 seconds   left with elbow at 90 deg   Wall Stretch - Flexion  1 rep;30 seconds   bil from door frame     Manual Therapy   Manual Therapy  Soft tissue mobilization    Manual therapy comments  Skilled palpation and monitoring of soft tissues during DN    Soft tissue mobilization  IASTM to left UT, supraspinatus and infraspinatus       Trigger Point Dry Needling - 10/27/19 0001    Consent Given?  Yes    Education Handout Provided  Previously provided    Muscles Treated Head and Neck  Upper trapezius    Dry Needling Comments  left    Upper Trapezius  Response  Twitch reponse elicited;Palpable increased muscle length                PT Long Term Goals - 10/27/19 1358      PT LONG TERM GOAL #1   Title  Pt will be independent in his HEP and progression.    Status  Achieved      PT LONG TERM GOAL #2   Title  pt will improve his FOTO score from 34% limitation to </= 24% limitation.    Status  Achieved      PT LONG TERM GOAL #3   Title  Pt will be able to perform pushups >/ 10 reps with no pain.    Status  Achieved      PT LONG TERM GOAL #4   Title  Pt will be able to run without pain in L shoulder.    Baseline  ran today without pain during shoulder extension    Status  Achieved      PT LONG TERM GOAL #5   Title  Pt will improve his L shoulder strength to 5/5 in order to improve functional mobility.    Baseline  5-/5 for left shoulder ABD, else 5/5    Status  On-going      Additional Long Term Goals   Additional Long Term Goals  Yes      PT LONG TERM GOAL #6   Title  Patient able to perform WBing through LUE and  tolerate bench press over 100# without pain in the left shoulder in order to pass [employer] testing.    Time  6    Period  Weeks    Status  New    Target Date  12/08/19            Plan - 10/27/19 1350    Clinical Impression Statement  Patient presents today with reports that he had pain with bench pressing this morning and that he experienced tingling over post left shoulder. PT advised reducing ROM so shoulder extension is limited. He had no pain with light weight simulation but will test with increased weight. He continues to have popping with horizontal ABD and ABD and reports some pain with resisted ABD. His strength in ABD is still 5-/5. He has tenderness of left supraspinatus muscle and had increased tension and TPs today in left UT. He responded well to DN here today. Patient has met all LTGs except strength, however due to instability in shoulder and pain with WBing through LUE he would benefit from further therapy. LTGs have been revised to meet functional goals so pt can pass his [employer] test. MRI has been ordered but not completed yet.    PT Frequency  1x / week    PT Duration  6 weeks    PT Next Visit Plan  Continue 1x/wk focus on post shoulder strength, shoulder stability, ant chest flexibilty.    PT Home Exercise Plan  Access Code: [email or web address]       Patient will benefit from skilled therapeutic intervention in order to improve the following deficits and impairments:  Pain, Postural dysfunction, Decreased strength, Decreased range of motion, Decreased activity tolerance  Visit Diagnosis: Acute pain of left shoulder - Plan: PT plan of care cert/re-cert  Muscle weakness (generalized) - Plan: PT plan of care cert/re-cert     Problem List Patient Active Problem List   Diagnosis Date Noted  . Flexural eczema 10/25/2019  . Irritant rhinitis 08/23/2019  .  Acute pain of left shoulder 08/23/2019  . 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 Closed  fracture of fourth toe of left foot 12/09/2017  . Posttraumatic stress disorder 07/02/2017  . Lumbar degenerative disc disease 03/07/2017  . Male infertility 03/30/2015  . Ganglion cyst of flexor tendon sheath of finger of right hand 03/07/2015  . Annual physical exam 03/29/2013  . Right carpal tunnel syndrome 03/29/2013    Madelyn Flavors PT 10/27/2019, 2:10 PM  [facility] [place], [place], [address] Phone: [phone]   Fax:  [phone]  Name: Kayon Dozier MRN: [id number] Date of Birth: Jul 19, 1991  PHYSICAL THERAPY DISCHARGE SUMMARY  Visits from Start of Care: 7  Current functional level related to goals / functional outcomes: Unable to assess as patient did not return   Remaining deficits: See above   Education / Equipment: HEP  Plan: Patient agrees to discharge.  Patient goals were partially met. Patient is being discharged due to not returning since the last visit.  ?????     Madelyn Flavors, PT 11/24/19 8:07 AM  [facility] [place],  [address]  [phone] (office) [phone] (fax)

## 2019-11-01 ENCOUNTER — Other Ambulatory Visit: Payer: Self-pay

## 2019-11-01 ENCOUNTER — Ambulatory Visit (INDEPENDENT_AMBULATORY_CARE_PROVIDER_SITE_OTHER): Payer: BC Managed Care – PPO

## 2019-11-01 ENCOUNTER — Ambulatory Visit (INDEPENDENT_AMBULATORY_CARE_PROVIDER_SITE_OTHER): Payer: BC Managed Care – PPO | Admitting: Sports Medicine

## 2019-11-01 DIAGNOSIS — M25512 Pain in left shoulder: Secondary | ICD-10-CM

## 2019-11-01 DIAGNOSIS — M19012 Primary osteoarthritis, left shoulder: Secondary | ICD-10-CM | POA: Diagnosis not present

## 2019-11-01 NOTE — Progress Notes (Signed)
   Procedure: Real-time Ultrasound Guided gadolinium contrast injection of left glenohumeral joint Device: Samsung [id number]  Verbal informed consent obtained.  Time-out conducted.  Noted no overlying erythema, induration, or other signs of local infection.  Skin prepped in a sterile fashion.  Local anesthesia: Topical Ethyl chloride.  With sterile technique and under real time ultrasound guidance: 22-gauge spinal needle advanced into the glenohumeral joint, I injected 1 cc Kenalog 40, 2 cc lidocaine, 2 cc bupivacaine, syringe switched and 0.1 mL of gadolinium injected, syringe again switched and 10 cc normal saline used to distend the joint. Joint visualized and capsule seen distending confirming intra-articular placement of contrast material and medication. Completed without difficulty  Advised to call if fevers/chills, erythema, induration, drainage, or persistent bleeding.  Images permanently stored and available for review in the ultrasound unit.  Impression: Technically successful ultrasound guided gadolinium contrast injection for MR arthrography.  Please see separate MR arthrogram report.   Acute pain of left shoulder Hao returns, he has failed conservative measures, he has labral and impingement signs so we proceeded with MRI arthrogram injection today. Further follow-up will be based on MRI report.

## 2019-11-01 NOTE — Assessment & Plan Note (Signed)
Adam Adams returns, he has failed conservative measures, he has labral and impingement signs so we proceeded with MRI arthrogram injection today. Further follow-up will be based on MRI report.

## 2019-11-10 ENCOUNTER — Encounter: Payer: BC Managed Care – PPO | Admitting: Physical Therapy

## 2019-12-10 DIAGNOSIS — Z20828 Contact with and (suspected) exposure to other viral communicable diseases: Secondary | ICD-10-CM | POA: Diagnosis not present

## 2020-01-27 DIAGNOSIS — Z3189 Encounter for other procreative management: Secondary | ICD-10-CM | POA: Diagnosis not present

## 2020-03-13 ENCOUNTER — Other Ambulatory Visit: Payer: Self-pay

## 2020-03-13 ENCOUNTER — Emergency Department (INDEPENDENT_AMBULATORY_CARE_PROVIDER_SITE_OTHER): Payer: Managed Care, Other (non HMO)

## 2020-03-13 ENCOUNTER — Emergency Department
Admission: EM | Admit: 2020-03-13 | Discharge: 2020-03-13 | Disposition: A | Discharge status: Home / Self Care | Payer: BC Managed Care – PPO | Source: Home / Self Care

## 2020-03-13 DIAGNOSIS — D72829 Elevated white blood cell count, unspecified: Secondary | ICD-10-CM

## 2020-03-13 DIAGNOSIS — R1084 Generalized abdominal pain: Secondary | ICD-10-CM | POA: Diagnosis not present

## 2020-03-13 DIAGNOSIS — R1013 Epigastric pain: Secondary | ICD-10-CM

## 2020-03-13 DIAGNOSIS — R0600 Dyspnea, unspecified: Secondary | ICD-10-CM | POA: Diagnosis not present

## 2020-03-13 DIAGNOSIS — R109 Unspecified abdominal pain: Secondary | ICD-10-CM

## 2020-03-13 DIAGNOSIS — R066 Hiccough: Secondary | ICD-10-CM

## 2020-03-13 DIAGNOSIS — R6881 Early satiety: Secondary | ICD-10-CM

## 2020-03-13 LAB — POCT CBC W AUTO DIFF (K'VILLE URGENT CARE)

## 2020-03-13 NOTE — Discharge Instructions (Signed)
  Your white blood cell count is very high this evening. Due to having a high blood count and abdominal pain, it is recommended you go to the hospital this evening for further evaluation of your abdominal pain with additional imaging and labs.  You have declined EMS transport. Please drive directly to the hospital.  Do not drink or eat until you are evaluated by a medical provider in case you need surgery this evening.

## 2020-03-13 NOTE — ED Provider Notes (Signed)
[facility]    CSN: [id number] Arrival date & time: 03/13/20  1616      History   Chief Complaint Chief Complaint  Patient presents with  . Hiccups  . Insect Bite    HPI Adam Adams is a 29 y.o. male.   HPI  Adam Adams is a 29 y.o. male presenting to UC with c/o intermittent hiccups for about 1 week, worse yesterday and today. He was working outside earlier today and felt like he could not catch his breath due to the hiccups. He had an appointment scheduled with PCP tomorrow but decided he should be evaluated today. He also reports 3-4 months of intermittent constipation and endorses early satiety without weight change.  Denies fever, chills, vomiting or diarrhea but has had some nausea.  He was seen in the ED 2 days ago after being stung by a bee in his Left upper arm. That pain and swelling has nearly resolve.  He is most concerned about the hiccups today.  Denies SOB at this time.    Past Medical History:  Diagnosis Date  . Male infertility 03/30/2015  . Right carpal tunnel syndrome 03/29/2013   Nerve conduction study shows right carpal tunnel syndrome without evidence of cervical radiculitis.     Patient Active Problem List   Diagnosis Date Noted  . Flexural eczema 10/25/2019  . Irritant rhinitis 08/23/2019  . Acute pain of left shoulder 08/23/2019  . 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 Closed fracture of fourth toe of left foot 12/09/2017  . Posttraumatic stress disorder 07/02/2017  . Lumbar degenerative disc disease 03/07/2017  . Male infertility 03/30/2015  . Ganglion cyst of flexor tendon sheath of finger of right hand 03/07/2015  . Annual physical exam 03/29/2013  . Right carpal tunnel syndrome 03/29/2013    History reviewed. No pertinent surgical history.     Home Medications    Prior to Admission medications   Medication Sig Start Date End Date Taking? Authorizing Provider  dexamethasone (DECADRON) 6 MG tablet Take 6  mg by mouth 2 (two) times daily with a meal.   Yes [provider]  famotidine (PEPCID) 20 MG tablet Take 20 mg by mouth 2 (two) times daily.   Yes [provider]  hydrOXYzine (ATARAX/VISTARIL) 25 MG tablet Take 25 mg by mouth 3 (three) times daily as needed.   Yes [provider]  fluticasone Aleda Grana) 50 MCG/ACT nasal spray One spray in each nostril twice a day 08/23/19   Monica Becton, MD  meloxicam (MOBIC) 15 MG tablet One tab PO qAM with a meal for 2 weeks, then daily prn pain. 10/25/19   Monica Becton, MD  triamcinolone ointment (KENALOG) 0.1 % Apply 1 application topically 2 (two) times daily. To affected areas 10/25/19   Monica Becton, MD    Family History Family History  Problem Relation Age of Onset  . Hypertension Mother     Social History Social History   Tobacco Use  . Smoking status: Never Smoker  . Smokeless tobacco: Never Used  Substance Use Topics  . Alcohol use: Yes    Comment: every other weekend  . Drug use: No     Allergies   Patient has no known allergies.   Review of Systems Review of Systems  Constitutional: Positive for appetite change (decreased, feels "full" after a small amount of food). Negative for chills and fever.  HENT: Negative for congestion, ear pain, sore throat, trouble swallowing and voice  change.   Respiratory: Negative for cough and shortness of breath.   Cardiovascular: Negative for chest pain and palpitations.  Gastrointestinal: Positive for abdominal pain and constipation. Negative for diarrhea, nausea and vomiting.  Musculoskeletal: Negative for arthralgias, back pain and myalgias.  Skin: Negative for rash.  All other systems reviewed and are negative.    Physical Exam Triage Vital Signs ED Triage Vitals  Enc Vitals Group     BP 03/13/20 1635 (!) 142/75     Pulse Rate 03/13/20 1635 68     Resp 03/13/20 1635 16     Temp 03/13/20 1635 98.3 F (36.8 C)     Temp Source  03/13/20 1635 Oral     SpO2 03/13/20 1635 99 %     Weight --      Height --      Head Circumference --      Peak Flow --      Pain Score 03/13/20 1631 1     Pain Loc --      Pain Edu? --      Excl. in GC? --    No data found.  Updated Vital Signs BP (!) 142/75 (BP Location: Right Arm)   Pulse 68   Temp 98.3 F (36.8 C) (Oral)   Resp 16   SpO2 99%   Visual Acuity Right Eye Distance:   Left Eye Distance:   Bilateral Distance:    Right Eye Near:   Left Eye Near:    Bilateral Near:     Physical Exam Vitals and nursing note reviewed.  Constitutional:      Appearance: Normal appearance. He is well-developed.  HENT:     Head: Normocephalic and atraumatic.  Cardiovascular:     Rate and Rhythm: Normal rate and regular rhythm.  Pulmonary:     Effort: Pulmonary effort is normal. No respiratory distress.     Breath sounds: Normal breath sounds. No stridor. No wheezing, rhonchi or rales.  Abdominal:     General: There is no distension.     Palpations: Abdomen is soft.     Tenderness: There is abdominal tenderness. There is no right CVA tenderness, left CVA tenderness, guarding or rebound.    Musculoskeletal:        General: Normal range of motion.     Cervical back: Normal range of motion.  Skin:    General: Skin is warm and dry.  Neurological:     Mental Status: He is alert and oriented to person, place, and time.  Psychiatric:        Behavior: Behavior normal.      UC Treatments / Results  Labs (all labs ordered are listed, but only abnormal results are displayed) Labs Reviewed  COMPLETE METABOLIC PANEL WITH GFR  LIPASE  POCT CBC W AUTO DIFF ([facility])    EKG   Radiology DG Chest 2 View  Result Date: 03/13/2020 CLINICAL DATA:  Hiccups, difficulty breathing, recent allergic reaction to bee sting EXAM: CHEST - 2 VIEW COMPARISON:  11/23/2016 FINDINGS: The heart size and mediastinal contours are within normal limits. Both lungs are clear. The  visualized skeletal structures are unremarkable. IMPRESSION: No active cardiopulmonary disease. Electronically Signed   By: Sharlet Salina M.D.   On: 03/13/2020 17:53   DG Abdomen 1 View  Result Date: 03/13/2020 CLINICAL DATA:  Hiccups, full sensation within the abdomen EXAM: ABDOMEN - 1 VIEW COMPARISON:  None. FINDINGS: 2 supine frontal views of the abdomen and pelvis demonstrate an  unremarkable bowel gas pattern. No masses or abnormal calcifications. No acute bony abnormalities. IMPRESSION: 1. Unremarkable exam. Electronically Signed   By: Sharlet Salina M.D.   On: 03/13/2020 17:54    Procedures Procedures (including critical care time)  Medications Ordered in UC Medications - No data to display  Initial Impression / Assessment and Plan / UC Course  I have reviewed the triage vital signs and the nursing notes.  Pertinent labs & imaging results that were available during my care of the patient were reviewed by me and considered in my medical decision making (see chart for details).     CBC: significant for WBC- 20.5 CMP and Lipase pending  Due to RUQ and epigastric tenderness on exam along with leukocytosis, recommend further evaluation this evening in emergency department. Pt understanding and agreeable to go to the hospital this evening. Pt safe for discharge to have his wife drive him to the hospital.   AVS given  Final Clinical Impressions(s) / UC Diagnoses   Final diagnoses:  Abdominal pain, epigastric  Generalized abdominal pain  Leukocytosis, unspecified type  Hiccups  Early satiety     Discharge Instructions      Your white blood cell count is very high this evening. Due to having a high blood count and abdominal pain, it is recommended you go to the hospital this evening for further evaluation of your abdominal pain with additional imaging and labs.  You have declined EMS transport. Please drive directly to the hospital.  Do not drink or eat until you are  evaluated by a medical provider in case you need surgery this evening.     ED Prescriptions    None     PDMP not reviewed this encounter.   Lurene Shadow, [place] 03/13/20 1847

## 2020-03-13 NOTE — ED Triage Notes (Addendum)
Patient presents to Urgent Care with complaints of hiccups since almost a week ago intermittnetly. Patient reports he was also stung by a bee last week and had a localized reaction which he was treated for.  Pt adds he thinks he might also be constipated, has been having digestive issues since January. Had a PCP appt for tomorrow but cancelled it and came here.

## 2020-03-14 ENCOUNTER — Ambulatory Visit: Payer: Managed Care, Other (non HMO) | Admitting: Sports Medicine

## 2020-03-14 ENCOUNTER — Encounter: Payer: Self-pay | Admitting: Sports Medicine

## 2020-03-14 DIAGNOSIS — M25512 Pain in left shoulder: Secondary | ICD-10-CM

## 2020-03-14 DIAGNOSIS — R8281 Pyuria: Secondary | ICD-10-CM | POA: Diagnosis not present

## 2020-03-14 DIAGNOSIS — A5601 Chlamydial cystitis and urethritis: Secondary | ICD-10-CM

## 2020-03-14 DIAGNOSIS — E6609 Other obesity due to excess calories: Secondary | ICD-10-CM

## 2020-03-14 DIAGNOSIS — Z6830 Body mass index (BMI) 30.0-30.9, adult: Secondary | ICD-10-CM

## 2020-03-14 DIAGNOSIS — E669 Obesity, unspecified: Secondary | ICD-10-CM | POA: Insufficient documentation

## 2020-03-14 LAB — COMPLETE METABOLIC PANEL WITH GFR
AG Ratio: 1.7 (calc) (ref 1.0–2.5)
ALT: 40 U/L (ref 9–46)
AST: 19 U/L (ref 10–40)
Albumin: 4.5 g/dL (ref 3.6–5.1)
Alkaline phosphatase (APISO): 50 U/L (ref 36–130)
BUN: 16 mg/dL (ref 7–25)
CO2: 22 mmol/L (ref 20–32)
Calcium: 9.6 mg/dL (ref 8.6–10.3)
Chloride: 109 mmol/L (ref 98–110)
Creat: 1.01 mg/dL (ref 0.60–1.35)
GFR, Est African American: 115 mL/min/{1.73_m2} (ref >=60)
GFR, Est Non African American: 99 mL/min/{1.73_m2} (ref >=60)
Globulin: 2.7 g/dL (calc) (ref 1.9–3.7)
Glucose, Bld: 147 mg/dL — ABNORMAL HIGH (ref 65–99)
Potassium: 4.1 mmol/L (ref 3.5–5.3)
Sodium: 138 mmol/L (ref 135–146)
Total Bilirubin: 0.3 mg/dL (ref 0.2–1.2)
Total Protein: 7.2 g/dL (ref 6.1–8.1)

## 2020-03-14 LAB — LIPASE: Lipase: 17 U/L (ref 7–60)

## 2020-03-14 NOTE — Assessment & Plan Note (Addendum)
Adam Adams was also recently seen in the hospital, he was noted to have a white blood cell count of 20,000, leukocytes in his blood. He was treated with Zosyn. We are going to do a repeat work-up, CBC, CMP, PSA, urinalysis with urine culture reflex, STD testing. He does endorse abdominal pain somewhat diffusely but also in the perineum concerning for prostatitis.  There is still some pyuria, white blood cell count of 18,000 though improved from 20,000 in the hospital, due to the pyuria and the possibility of prostatitis we are going to go ahead and start antibiotics, ciprofloxacin 750 mg twice daily for 14 days to start out with, this may change once I get the culture results.  Discontinue ciprofloxacin, he needs to come in and a nurse visit for 1000 mg of azithromycin oral, followed by 2 weeks for repeat visit for test of cure.  No sexual intercourse in the meantime.

## 2020-03-14 NOTE — Progress Notes (Addendum)
    Procedures performed today:    None.  Independent interpretation of notes and tests performed by another provider:   None.  Brief History, Exam, Impression, and Recommendations:    Acute pain of left shoulder Adam Adams has had chronic shoulder pain, ultimately we obtained an MR arthrogram that showed some labral tearing, as well as acromioclavicular osteoarthritis. He seems to be doing well with massage therapy and chiropractic manipulation and feels as though he can live with it, he does understand that I do not have any further options from a nonsurgical perspective and that we would likely refer him to Dr. Everardo Pacific if the pain becomes intolerable.  Chlamydial urethritis in male Adam Adams was also recently seen in the hospital, he was noted to have a white blood cell count of 20,000, leukocytes in his blood. He was treated with Zosyn. We are going to do a repeat work-up, CBC, CMP, PSA, urinalysis with urine culture reflex, STD testing. He does endorse abdominal pain somewhat diffusely but also in the perineum concerning for prostatitis.  There is still some pyuria, white blood cell count of 18,000 though improved from 20,000 in the hospital, due to the pyuria and the possibility of prostatitis we are going to go ahead and start antibiotics, ciprofloxacin 750 mg twice daily for 14 days to start out with, this may change once I get the culture results.  Discontinue ciprofloxacin, he needs to come in and a nurse visit for 1000 mg of azithromycin oral, followed by 2 weeks for repeat visit for test of cure.  No sexual intercourse in the meantime.  Obesity Adam Adams was noted to have hepatic steatosis. He is obese. We need to work aggressively on weight loss, he will be referred to the healthy weight [facility] wellness center.    ___________________________________________ [name]. Benjamin Stain, M.D., ABFM., CAQSM. Primary Care and Sports Medicine [facility]  Adjunct  Instructor of Family Medicine  [employer]

## 2020-03-14 NOTE — Assessment & Plan Note (Signed)
Adam Adams has had chronic shoulder pain, ultimately we obtained an MR arthrogram that showed some labral tearing, as well as acromioclavicular osteoarthritis. He seems to be doing well with massage therapy and chiropractic manipulation and feels as though he can live with it, he does understand that I do not have any further options from a nonsurgical perspective and that we would likely refer him to Dr. Everardo Pacific if the pain becomes intolerable.

## 2020-03-14 NOTE — Assessment & Plan Note (Signed)
Adam Adams was noted to have hepatic steatosis. He is obese. We need to work aggressively on weight loss, he will be referred to the healthy [facility] wellness center.

## 2020-03-15 ENCOUNTER — Other Ambulatory Visit: Payer: Self-pay | Admitting: Sports Medicine

## 2020-03-15 MED ORDER — VITAMIN D (ERGOCALCIFEROL) 1.25 MG (50000 UNIT) PO CAPS: 50000.0000 [IU] | ORAL_CAPSULE | ORAL | 0 refills | Status: DC

## 2020-03-15 MED ORDER — CIPROFLOXACIN HCL 750 MG PO TABS: 750.0000 mg | ORAL_TABLET | Freq: Two times a day (BID) | ORAL | 0 refills | Status: DC

## 2020-03-15 NOTE — Addendum Note (Signed)
Addended by: Monica Becton on: 03/15/2020 09:28 AM   Modules accepted: Orders

## 2020-03-15 NOTE — Addendum Note (Signed)
Addended by: Monica Becton on: 03/15/2020 01:26 PM   Modules accepted: Orders

## 2020-03-16 LAB — CBC WITH DIFFERENTIAL/PLATELET
Absolute Monocytes: 1219 cells/uL — ABNORMAL HIGH (ref 200–950)
Basophils Absolute: 18 cells/uL (ref 0–200)
Basophils Relative: 0.1 %
Eosinophils Absolute: 0 cells/uL — ABNORMAL LOW (ref 15–500)
Eosinophils Relative: 0 %
HCT: 43.6 % (ref 38.5–50.0)
Hemoglobin: 14.6 g/dL (ref 13.2–17.1)
Lymphs Abs: 1656 cells/uL (ref 850–3900)
MCH: 31.7 pg (ref 27.0–33.0)
MCHC: 33.5 g/dL (ref 32.0–36.0)
MCV: 94.8 fL (ref 80.0–100.0)
MPV: 10.2 fL (ref 7.5–12.5)
Monocytes Relative: 6.7 %
Neutro Abs: 15306 cells/uL — ABNORMAL HIGH (ref 1500–7800)
Neutrophils Relative %: 84.1 %
Platelets: 346 10*3/uL (ref 140–400)
RBC: 4.6 10*6/uL (ref 4.20–5.80)
RDW: 12.4 % (ref 11.0–15.0)
Total Lymphocyte: 9.1 %
WBC: 18.2 10*3/uL — ABNORMAL HIGH (ref 3.8–10.8)

## 2020-03-16 LAB — URINALYSIS W MICROSCOPIC + REFLEX CULTURE
Bilirubin Urine: NEGATIVE
Glucose, UA: NEGATIVE
Hgb urine dipstick: NEGATIVE
Ketones, ur: NEGATIVE
Nitrites, Initial: NEGATIVE
Protein, ur: NEGATIVE
RBC / HPF: NONE SEEN /HPF (ref 0–2)
Specific Gravity, Urine: 1.039 — ABNORMAL HIGH (ref 1.001–1.03)
pH: 6 (ref 5.0–8.0)

## 2020-03-16 LAB — URINE CULTURE
MICRO NUMBER:: 10703142
Result:: NO GROWTH
SPECIMEN QUALITY:: ADEQUATE

## 2020-03-16 LAB — PSA, TOTAL AND FREE
PSA, % Free: 18 % (calc) — ABNORMAL LOW (ref >25)
PSA, Free: 0.2 ng/mL
PSA, Total: 1.1 ng/mL (ref <=4.0)

## 2020-03-16 LAB — COMPLETE METABOLIC PANEL WITH GFR
AG Ratio: 1.6 (calc) (ref 1.0–2.5)
ALT: 42 U/L (ref 9–46)
AST: 18 U/L (ref 10–40)
Albumin: 4.3 g/dL (ref 3.6–5.1)
Alkaline phosphatase (APISO): 43 U/L (ref 36–130)
BUN: 20 mg/dL (ref 7–25)
CO2: 26 mmol/L (ref 20–32)
Calcium: 9.3 mg/dL (ref 8.6–10.3)
Chloride: 106 mmol/L (ref 98–110)
Creat: 0.89 mg/dL (ref 0.60–1.35)
GFR, Est African American: 133 mL/min/{1.73_m2} (ref >=60)
GFR, Est Non African American: 115 mL/min/{1.73_m2} (ref >=60)
Globulin: 2.7 g/dL (calc) (ref 1.9–3.7)
Glucose, Bld: 98 mg/dL (ref 65–99)
Potassium: 4 mmol/L (ref 3.5–5.3)
Sodium: 141 mmol/L (ref 135–146)
Total Bilirubin: 0.5 mg/dL (ref 0.2–1.2)
Total Protein: 7 g/dL (ref 6.1–8.1)

## 2020-03-16 LAB — HEPATITIS PANEL, ACUTE
Hep A IgM: NONREACTIVE
Hep B C IgM: NONREACTIVE
Hepatitis B Surface Ag: NONREACTIVE
Hepatitis C Ab: NONREACTIVE
SIGNAL TO CUT-OFF: 0.11 (ref <1.00)

## 2020-03-16 LAB — C. TRACHOMATIS/N. GONORRHOEAE RNA
C. trachomatis RNA, TMA: DETECTED — AB
N. gonorrhoeae RNA, TMA: NOT DETECTED

## 2020-03-16 LAB — RPR: RPR Ser Ql: NONREACTIVE

## 2020-03-16 LAB — HIV ANTIBODY (ROUTINE TESTING W REFLEX): HIV 1&2 Ab, 4th Generation: NONREACTIVE

## 2020-03-16 LAB — VITAMIN D 25 HYDROXY (VIT D DEFICIENCY, FRACTURES): Vit D, 25-Hydroxy: 24 ng/mL — ABNORMAL LOW (ref 30–100)

## 2020-03-16 LAB — CULTURE INDICATED

## 2020-03-21 ENCOUNTER — Ambulatory Visit: Payer: Managed Care, Other (non HMO) | Admitting: Sports Medicine

## 2020-03-23 ENCOUNTER — Ambulatory Visit (INDEPENDENT_AMBULATORY_CARE_PROVIDER_SITE_OTHER): Payer: Managed Care, Other (non HMO) | Admitting: Sports Medicine

## 2020-03-23 VITALS — BP 127/72 | HR 60

## 2020-03-23 DIAGNOSIS — A5601 Chlamydial cystitis and urethritis: Secondary | ICD-10-CM

## 2020-03-23 MED ORDER — AZITHROMYCIN 250 MG PO TABS
1000.0000 mg | ORAL_TABLET | Freq: Every day | ORAL | Status: DC
  Administered 2020-03-23: 1000 mg via ORAL

## 2020-03-23 NOTE — Progress Notes (Signed)
Patient comes in for STD treatment. Two 500 mg Azithromycin tablets given to patient. He was given lab requisition to have done in two weeks to make sure infection has cleared.

## 2020-03-28 ENCOUNTER — Other Ambulatory Visit: Payer: Self-pay

## 2020-03-28 ENCOUNTER — Ambulatory Visit (INDEPENDENT_AMBULATORY_CARE_PROVIDER_SITE_OTHER): Payer: Managed Care, Other (non HMO) | Admitting: Family Medicine

## 2020-03-28 ENCOUNTER — Encounter (INDEPENDENT_AMBULATORY_CARE_PROVIDER_SITE_OTHER): Payer: Self-pay | Admitting: Family Medicine

## 2020-03-28 VITALS — BP 117/79 | HR 77 | Temp 97.9°F | Wt 203.0 lb

## 2020-03-28 DIAGNOSIS — R5383 Other fatigue: Secondary | ICD-10-CM | POA: Diagnosis not present

## 2020-03-28 DIAGNOSIS — Z1331 Encounter for screening for depression: Secondary | ICD-10-CM | POA: Diagnosis not present

## 2020-03-28 DIAGNOSIS — Z9189 Other specified personal risk factors, not elsewhere classified: Secondary | ICD-10-CM | POA: Diagnosis not present

## 2020-03-28 DIAGNOSIS — K760 Fatty (change of) liver, not elsewhere classified: Secondary | ICD-10-CM | POA: Diagnosis not present

## 2020-03-28 DIAGNOSIS — E669 Obesity, unspecified: Secondary | ICD-10-CM

## 2020-03-28 DIAGNOSIS — Z0289 Encounter for other administrative examinations: Secondary | ICD-10-CM

## 2020-03-28 DIAGNOSIS — E559 Vitamin D deficiency, unspecified: Secondary | ICD-10-CM

## 2020-03-28 DIAGNOSIS — Z6830 Body mass index (BMI) 30.0-30.9, adult: Secondary | ICD-10-CM

## 2020-03-28 DIAGNOSIS — R739 Hyperglycemia, unspecified: Secondary | ICD-10-CM

## 2020-03-28 NOTE — Progress Notes (Signed)
Chief Complaint:   OBESITY Adam Adams (MR# [id number]) is a 29 y.o. male who presents for evaluation and treatment of obesity and related comorbidities. Current BMI is Body mass index is 31.79 kg/m. Adam Adams has been struggling with his weight for many years and has been unsuccessful in either losing weight, maintaining weight loss, or reaching his healthy weight goal.  Adam Adams has lost weight with intermittent fasting, and had been 230 lbs, and lost down to 180 lbs last year. He wants to lose back to between 180 and 185 lb range. He is active and in the [employer].  Adam Adams is currently in the action stage of change and ready to dedicate time achieving and maintaining a healthier weight. Adam Adams is interested in becoming our patient and working on intensive lifestyle modifications including (but not limited to) diet and exercise for weight loss.  Adam Adams's habits were reviewed today and are as follows: His family eats meals together, he thinks his family will eat healthier with him, he struggles with family and or coworkers weight loss sabotage, his desired weight loss is 18-23 lbs, he started gaining weight at 23, his heaviest weight ever was 236 pounds, he is a picky eater and doesn't like to eat healthier foods, he has significant food cravings issues, he skips meals frequently, he is frequently drinking liquids with calories, he frequently eats larger portions than normal and he struggles with emotional eating.  Depression Screen Adam Adams's Food and Mood (modified PHQ-9) score was 6.  Depression screen PHQ 2/9 03/28/2020  Decreased Interest 1  Down, Depressed, Hopeless 0  PHQ - 2 Score 1  Altered sleeping 0  Tired, decreased energy 2  Change in appetite 3  Feeling bad or failure about yourself  0  Trouble concentrating 0  Moving slowly or fidgety/restless 0  Suicidal thoughts 0  PHQ-9 Score 6  Difficult doing work/chores Not difficult at all   Subjective:   1. Other fatigue Adam Adams  admits to daytime somnolence and admits to waking up still tired. Patent has a history of symptoms of daytime fatigue and morning headache. Adam Adams generally gets 5 or 6 hours of sleep per night, and states that he has difficulty falling asleep. Snoring is present. Apneic episodes are not present. Epworth Sleepiness Score is 7.  2. Vitamin D deficiency Adam Adams's last Vit D level was low, and he notes fatigue.  3. NAFLD (nonalcoholic fatty liver disease) Adam Adams has a relatively new diagnosis of non alcoholic fatty liver disease. He is attempting to improve with weight loss. He denies abdominal pain.  4. Hyperglycemia Adam Adams has a history of elevated glucose readings and signs of insulin resistance, including lack of morning hunger.  5. At risk for impaired metabolic function Adam Adams is at increased risk for impaired metabolic function due to likely excessive calorie restriction.  Assessment/Plan:   1. Other fatigue Adam Adams does feel that his weight is causing his energy to be lower than it should be. Fatigue may be related to obesity, depression or many other causes. Labs will be ordered, and in the meanwhile, Adam Adams will focus on self care including making healthy food choices, increasing physical activity and focusing on stress reduction.  - CBC with Differential/Platelet - Comprehensive metabolic panel - Lipid Panel With LDL/HDL Ratio - T3 - T4, free - TSH  2. Vitamin D deficiency Low Vitamin D level contributes to fatigue and are associated with obesity, breast, and colon cancer. We will check labs today. Adam Adams will follow-up for routine testing  of Vitamin D, at least 2-3 times per year to avoid over-replacement.  - VITAMIN D 25 Hydroxy (Vit-D Deficiency, Fractures)  3. NAFLD (nonalcoholic fatty liver disease) We discussed the likely diagnosis of non-alcoholic fatty liver disease today and how this condition is obesity related. Adam Adams was educated the importance of weight loss. We will check  labs today. Adam Adams agreed to start his eating plan, and will continue with his weight loss efforts with healthier diet and exercise as an essential part of his treatment plan.  - Comprehensive metabolic panel  4. Hyperglycemia Fasting labs will be obtained today, and results with be discussed with Adam Adams in 2 weeks at his follow up visit. In the meanwhile [name] will start his eating plan and will work on weight loss efforts.  - Hemoglobin A1c - Insulin, random  5. Depression screening Adam Adams had a positive depression screening. Depression is commonly associated with obesity and often results in emotional eating behaviors. We will monitor this closely and work on CBT to help improve the non-hunger eating patterns. Referral to Psychology may be required if no improvement is seen as he continues in our clinic.  6. At risk for impaired metabolic function Adam Adams was given approximately 30 minutes of impaired  metabolic function prevention counseling today. We discussed intensive lifestyle modifications today with an emphasis on specific nutrition and exercise instructions and strategies.   Repetitive spaced learning was employed today to elicit superior memory formation and behavioral change.  7. Class 1 obesity with serious comorbidity and body mass index (BMI) of 30.0 to 30.9 in adult, unspecified obesity type Adam Adams is currently in the action stage of change and his goal is to continue with weight loss efforts. I recommend Adam Adams begin the structured treatment plan as follows:  He has agreed to keeping a food journal and adhering to recommended goals of 1800-2000 calories and 120+ grams of protein daily.  Exercise goals: As is.  Behavioral modification strategies: increasing lean protein intake.  He was informed of the importance of frequent follow-up visits to maximize his success with intensive lifestyle modifications for his multiple health conditions. He was informed we would discuss his lab  results at his next visit unless there is a critical issue that needs to be addressed sooner. Adam Adams agreed to keep his next visit at the agreed upon time to discuss these results.  Objective:   Blood pressure 117/79, pulse 77, temperature 97.9 F (36.6 C), temperature source Oral, weight (!) 203 lb (92.1 kg), SpO2 99 %. Body mass index is 31.79 kg/m.  EKG: Normal sinus rhythm, rate N/A.  Indirect Calorimeter completed today shows a VO2 of 372 and a REE of 2586.  His calculated basal metabolic rate is 6144 thus his basal metabolic rate is unchanged than expected.  General: Cooperative, alert, well developed, in no acute distress. HEENT: Conjunctivae and lids unremarkable. Cardiovascular: Regular rhythm.  Lungs: Normal work of breathing. Neurologic: No focal deficits.   Lab Results  Component Value Date   CREATININE 0.89 03/14/2020   BUN 20 03/14/2020   NA 141 03/14/2020   K 4.0 03/14/2020   CL 106 03/14/2020   CO2 26 03/14/2020   Lab Results  Component Value Date   ALT 42 03/14/2020   AST 18 03/14/2020   ALKPHOS 54 11/23/2016   BILITOT 0.5 03/14/2020   Lab Results  Component Value Date   HGBA1C 5.5 12/09/2017   HGBA1C 5.8 (H) 05/31/2014   No results found for: INSULIN Lab Results  Component Value  Date   TSH 1.02 12/09/2017   Lab Results  Component Value Date   CHOL 163 12/09/2017   HDL 65 12/09/2017   LDLCALC 85 12/09/2017   TRIG 58 12/09/2017   CHOLHDL 2.5 12/09/2017   Lab Results  Component Value Date   WBC 18.2 (H) 03/14/2020   HGB 14.6 03/14/2020   HCT 43.6 03/14/2020   MCV 94.8 03/14/2020   PLT 346 03/14/2020   No results found for: IRON, TIBC, FERRITIN  Attestation Statements:   Reviewed by clinician on day of visit: allergies, medications, problem list, medical history, surgical history, family history, social history, and previous encounter notes.   I, Burt Knack, am acting as transcriptionist for Quillian Quince, MD.  I have reviewed the  above documentation for accuracy and completeness, and I agree with the above. - Quillian Quince, MD

## 2020-03-29 LAB — CBC WITH DIFFERENTIAL/PLATELET
Basophils Absolute: 0.1 10*3/uL (ref 0.0–0.2)
Basos: 1 %
EOS (ABSOLUTE): 0.2 10*3/uL (ref 0.0–0.4)
Eos: 3 %
Hematocrit: 46.6 % (ref 37.5–51.0)
Hemoglobin: 15.7 g/dL (ref 13.0–17.7)
Immature Grans (Abs): 0.1 10*3/uL (ref 0.0–0.1)
Immature Granulocytes: 1 %
Lymphocytes Absolute: 2 10*3/uL (ref 0.7–3.1)
Lymphs: 30 %
MCH: 30.9 pg (ref 26.6–33.0)
MCHC: 33.7 g/dL (ref 31.5–35.7)
MCV: 92 fL (ref 79–97)
Monocytes Absolute: 0.7 10*3/uL (ref 0.1–0.9)
Monocytes: 9 %
Neutrophils Absolute: 3.9 10*3/uL (ref 1.4–7.0)
Neutrophils: 56 %
Platelets: 249 10*3/uL (ref 150–450)
RBC: 5.08 x10E6/uL (ref 4.14–5.80)
RDW: 12.7 % (ref 11.6–15.4)
WBC: 6.9 10*3/uL (ref 3.4–10.8)

## 2020-03-29 LAB — LIPID PANEL WITH LDL/HDL RATIO
Cholesterol, Total: 209 mg/dL — ABNORMAL HIGH (ref 100–199)
HDL: 58 mg/dL (ref >39)
LDL Chol Calc (NIH): 140 mg/dL — ABNORMAL HIGH (ref 0–99)
LDL/HDL Ratio: 2.4 ratio (ref 0.0–3.6)
Triglycerides: 64 mg/dL (ref 0–149)
VLDL Cholesterol Cal: 11 mg/dL (ref 5–40)

## 2020-03-29 LAB — COMPREHENSIVE METABOLIC PANEL
ALT: 38 IU/L (ref 0–44)
AST: 25 IU/L (ref 0–40)
Albumin/Globulin Ratio: 1.8 (ref 1.2–2.2)
Albumin: 4.6 g/dL (ref 4.1–5.2)
Alkaline Phosphatase: 65 IU/L (ref 48–121)
BUN/Creatinine Ratio: 18 (ref 9–20)
BUN: 15 mg/dL (ref 6–20)
Bilirubin Total: 0.3 mg/dL (ref 0.0–1.2)
CO2: 23 mmol/L (ref 20–29)
Calcium: 9.8 mg/dL (ref 8.7–10.2)
Chloride: 106 mmol/L (ref 96–106)
Creatinine, Ser: 0.84 mg/dL (ref 0.76–1.27)
GFR calc Af Amer: 136 mL/min/{1.73_m2} (ref >59)
GFR calc non Af Amer: 117 mL/min/{1.73_m2} (ref >59)
Globulin, Total: 2.5 g/dL (ref 1.5–4.5)
Glucose: 93 mg/dL (ref 65–99)
Potassium: 4.4 mmol/L (ref 3.5–5.2)
Sodium: 141 mmol/L (ref 134–144)
Total Protein: 7.1 g/dL (ref 6.0–8.5)

## 2020-03-29 LAB — INSULIN, RANDOM: INSULIN: 28.7 u[IU]/mL — ABNORMAL HIGH (ref 2.6–24.9)

## 2020-03-29 LAB — TSH: TSH: 1.36 u[IU]/mL (ref 0.450–4.500)

## 2020-03-29 LAB — HEMOGLOBIN A1C
Est. average glucose Bld gHb Est-mCnc: 117 mg/dL
Hgb A1c MFr Bld: 5.7 % — ABNORMAL HIGH (ref 4.8–5.6)

## 2020-03-29 LAB — T4, FREE: Free T4: 1.56 ng/dL (ref 0.82–1.77)

## 2020-03-29 LAB — T3: T3, Total: 153 ng/dL (ref 71–180)

## 2020-03-29 LAB — VITAMIN D 25 HYDROXY (VIT D DEFICIENCY, FRACTURES): Vit D, 25-Hydroxy: 30.7 ng/mL (ref 30.0–100.0)

## 2020-04-06 ENCOUNTER — Ambulatory Visit (INDEPENDENT_AMBULATORY_CARE_PROVIDER_SITE_OTHER): Payer: Managed Care, Other (non HMO) | Admitting: Family Medicine

## 2020-04-06 ENCOUNTER — Other Ambulatory Visit: Payer: Self-pay

## 2020-04-06 ENCOUNTER — Encounter (INDEPENDENT_AMBULATORY_CARE_PROVIDER_SITE_OTHER): Payer: Self-pay | Admitting: Family Medicine

## 2020-04-06 VITALS — BP 117/73 | HR 68 | Temp 97.8°F | Ht 68.0 in | Wt 203.0 lb

## 2020-04-06 DIAGNOSIS — Z9189 Other specified personal risk factors, not elsewhere classified: Secondary | ICD-10-CM

## 2020-04-06 DIAGNOSIS — E669 Obesity, unspecified: Secondary | ICD-10-CM | POA: Diagnosis not present

## 2020-04-06 DIAGNOSIS — E559 Vitamin D deficiency, unspecified: Secondary | ICD-10-CM | POA: Diagnosis not present

## 2020-04-06 DIAGNOSIS — R7303 Prediabetes: Secondary | ICD-10-CM | POA: Diagnosis not present

## 2020-04-06 DIAGNOSIS — Z6831 Body mass index (BMI) 31.0-31.9, adult: Secondary | ICD-10-CM

## 2020-04-06 MED ORDER — METFORMIN HCL 500 MG PO TABS: 500.0000 mg | ORAL_TABLET | Freq: Every day | ORAL | 0 refills | Status: DC

## 2020-04-06 NOTE — Progress Notes (Signed)
Chief Complaint:   OBESITY Adam Adams is here to discuss his progress with his obesity treatment plan along with follow-up of his obesity related diagnoses. Adam Adams is on keeping a food journal and adhering to recommended goals of 1800-2000 calories and 120+ grams of protein daily and states he is following his eating plan approximately 80% of the time. Adam Adams states he is running for 30 minutes 3 times per week.  Today's visit was #: 2 Starting weight: 203 lbs Starting date: 03/28/2020 Today's weight: 203 lbs Today's date: 04/06/2020 Total lbs lost to date: 0 Total lbs lost since last in-office visit: 0  Interim History: Adam Adams maintained his weight since his last visit. He did try to journal but it appears he struggled with this. He wants to go back to Keto and intermittent fasting.  Subjective:   1. Vitamin D deficiency Adam Adams was given Vit D prescription by his primary care physician, but he didn't start it yet. His Vit D level is not at goal. I discussed labs with the patient today.  2. Pre-diabetes Adam Adams has a new diagnosis of pre-diabetes based on his elevated A1c and fasting insulin. I discussed labs with the patient today.  3. At risk for diabetes mellitus Adam Adams is at higher than average risk for developing diabetes due to his obesity.   Assessment/Plan:   1. Vitamin D deficiency Low Vitamin D level contributes to fatigue and are associated with obesity, breast, and colon cancer. Adam Adams agreed to start prescription Vitamin D as prescribed and will follow-up for routine testing of Vitamin D, at least 2-3 times per year to avoid over-replacement.  2. Pre-diabetes Adam Adams will continue to work on weight loss, diet, exercise, and decreasing simple carbohydrates to help decrease the risk of diabetes. Adam Adams agreed to start metformin 500 mg q AM with food with no refills.  - metFORMIN (GLUCOPHAGE) 500 MG tablet; Take 1 tablet (500 mg total) by mouth daily with breakfast.  Dispense: 30  tablet; Refill: 0  3. At risk for diabetes mellitus Adam Adams was given approximately 30 minutes of diabetes education and counseling today. We discussed intensive lifestyle modifications today with an emphasis on weight loss as well as increasing exercise and decreasing simple carbohydrates in his diet. We also reviewed medication options with an emphasis on risk versus benefit of those discussed.   Repetitive spaced learning was employed today to elicit superior memory formation and behavioral change.  4. Class 1 obesity with serious comorbidity and body mass index (BMI) of 31.0 to 31.9 in adult, unspecified obesity type Adam Adams is currently in the action stage of change. As such, his goal is to continue with weight loss efforts. He has agreed to keeping a food journal and adhering to recommended goals of 1800-2000 calories and 120+ grams of protein daily.   Adam Adams is to journal and he may change his food timing and options as he likes, but he needs to meet the nutritional goals I prescribed.  Exercise goals: As is.  Behavioral modification strategies: increasing lean protein intake.  Adam Adams has agreed to follow-up with our clinic in 3 weeks. He was informed of the importance of frequent follow-up visits to maximize his success with intensive lifestyle modifications for his multiple health conditions.   Objective:   Blood pressure 117/73, pulse 68, temperature 97.8 F (36.6 C), temperature source Oral, height 5\' 8"  (1.727 m), weight 203 lb (92.1 kg), SpO2 98 %. Body mass index is 30.87 kg/m.  General: Cooperative, alert, well developed, in  no acute distress. HEENT: Conjunctivae and lids unremarkable. Cardiovascular: Regular rhythm.  Lungs: Normal work of breathing. Neurologic: No focal deficits.   Lab Results  Component Value Date   CREATININE 0.84 03/28/2020   BUN 15 03/28/2020   NA 141 03/28/2020   K 4.4 03/28/2020   CL 106 03/28/2020   CO2 23 03/28/2020   Lab Results  Component  Value Date   ALT 38 03/28/2020   AST 25 03/28/2020   ALKPHOS 65 03/28/2020   BILITOT 0.3 03/28/2020   Lab Results  Component Value Date   HGBA1C 5.7 (H) 03/28/2020   HGBA1C 5.5 12/09/2017   HGBA1C 5.8 (H) 05/31/2014   Lab Results  Component Value Date   INSULIN 28.7 (H) 03/28/2020   Lab Results  Component Value Date   TSH 1.360 03/28/2020   Lab Results  Component Value Date   CHOL 209 (H) 03/28/2020   HDL 58 03/28/2020   LDLCALC 140 (H) 03/28/2020   TRIG 64 03/28/2020   CHOLHDL 2.5 12/09/2017   Lab Results  Component Value Date   WBC 6.9 03/28/2020   HGB 15.7 03/28/2020   HCT 46.6 03/28/2020   MCV 92 03/28/2020   PLT 249 03/28/2020   No results found for: IRON, TIBC, FERRITIN  Attestation Statements:   Reviewed by clinician on day of visit: allergies, medications, problem list, medical history, surgical history, family history, social history, and previous encounter notes.   I, Burt Knack, am acting as transcriptionist for Quillian Quince, MD.  I have reviewed the above documentation for accuracy and completeness, and I agree with the above. -  Quillian Quince, MD

## 2020-04-07 ENCOUNTER — Ambulatory Visit: Payer: Managed Care, Other (non HMO) | Admitting: Sports Medicine

## 2020-04-08 LAB — C. TRACHOMATIS/N. GONORRHOEAE RNA
C. trachomatis RNA, TMA: NOT DETECTED
N. gonorrhoeae RNA, TMA: NOT DETECTED

## 2020-04-25 ENCOUNTER — Encounter (INDEPENDENT_AMBULATORY_CARE_PROVIDER_SITE_OTHER): Payer: Self-pay | Admitting: Adult Health

## 2020-04-25 ENCOUNTER — Ambulatory Visit (INDEPENDENT_AMBULATORY_CARE_PROVIDER_SITE_OTHER): Payer: Managed Care, Other (non HMO) | Admitting: Adult Health

## 2020-04-25 ENCOUNTER — Other Ambulatory Visit: Payer: Self-pay

## 2020-04-25 VITALS — BP 121/82 | HR 61 | Temp 98.0°F | Ht 68.0 in | Wt 204.0 lb

## 2020-04-25 DIAGNOSIS — E559 Vitamin D deficiency, unspecified: Secondary | ICD-10-CM | POA: Insufficient documentation

## 2020-04-25 DIAGNOSIS — R7303 Prediabetes: Secondary | ICD-10-CM

## 2020-04-25 DIAGNOSIS — Z6831 Body mass index (BMI) 31.0-31.9, adult: Secondary | ICD-10-CM | POA: Diagnosis not present

## 2020-04-25 DIAGNOSIS — E669 Obesity, unspecified: Secondary | ICD-10-CM | POA: Diagnosis not present

## 2020-04-25 NOTE — Progress Notes (Signed)
Chief Complaint:   OBESITY Adam Adams is here to discuss his progress with his obesity treatment plan along with follow-up of his obesity related diagnoses. [name] is keeping a food journal and adhering to recommended goals of 1800-2000 calories and 120 grams of protein and states he is following his eating plan approximately 80% of the time. [name] states he is doing cardio/resistance 30-45 minutes 3 times per week.  Today's visit was #: 3 Starting weight: 203 lbs Starting date: 03/28/2020 Today's weight: 204 lbs Today's date: 04/25/2020 Total lbs lost to date: 0 Total lbs lost since last in-office visit: 0  Interim History: [name] estimates to track 80% of the time and hit his calories and protein goals approximately 3 times per week. 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  Subjective:   Vitamin D deficiency. Norm is on Ergocalciferol. No nausea, vomiting, or muscle weakness.    Ref. Range 03/28/2020 12:25  Vitamin D, 25-Hydroxy Latest Ref Range: 30.0 - 100.0 ng/mL 30.7   Prediabetes. Candy has a diagnosis of prediabetes based on his elevated HgA1c and was informed this puts him at greater risk of developing diabetes. He continues to work on diet and exercise to decrease his risk of diabetes. He denies nausea or hypoglycemia. [name] is on metformin 500 mg daily with breakfast and denies GI upset.  Lab Results  Component Value Date   HGBA1C 5.7 (H) 03/28/2020   Lab Results  Component Value Date   INSULIN 28.7 (H) 03/28/2020   Assessment/Plan:   Vitamin D deficiency. Low Vitamin D level contributes to fatigue and are associated with obesity, breast, and colon cancer. He agrees to continue to take prescription Ergocalciferol as directed (no medication refill today) and will follow-up for routine testing of Vitamin D every 3 months.  Prediabetes. [name] will continue to  work on weight loss, exercise, and decreasing simple carbohydrates to help decrease the risk of diabetes. He will continue metformin as directed (no medication refill today) and will increase his protein intake. Labs will be checked every 3 months.   Class 1 obesity with serious comorbidity and body mass index (BMI) of 31.0 to 31.9 in adult, unspecified obesity type.  [name] is currently in the action stage of change. As such, his goal is to continue with weight loss efforts. He has agreed to keeping a food journal and adhering to recommended goals of 1800-2000 calories and 120 grams of protein daily. It was recommended he download "My Fitness Pal" and track all food/liquid intake.  Handout was provided on Eating Out Guide.   Exercise goals: [name] will continue his current exercise regimen.   Behavioral modification strategies: increasing lean protein intake, increasing water intake, meal planning and cooking strategies, better snacking choices, planning for success and keeping a strict food journal.  [name] has agreed to follow-up with our clinic in 2 weeks. He was informed of the importance of frequent follow-up visits to maximize his success with intensive lifestyle modifications for his multiple health conditions.   Objective:   Blood pressure 121/82, pulse 61, temperature 98 F (36.7 C), temperature source Oral, height 5\' 8"  (1.727 m), weight 204 lb (92.5 kg), SpO2 99 %. Body mass index is 31.02 kg/m.  General: Cooperative, alert, well developed, in no acute distress. HEENT: Conjunctivae and lids unremarkable. Cardiovascular: Regular rhythm.  Lungs: Normal work of breathing. Neurologic: No focal deficits.   Lab  Results  Component Value Date   CREATININE 0.84 03/28/2020   BUN 15 03/28/2020   NA 141 03/28/2020   K 4.4 03/28/2020   CL 106 03/28/2020   CO2 23 03/28/2020   Lab Results  Component Value Date   ALT 38 03/28/2020   AST 25 03/28/2020   ALKPHOS 65 03/28/2020    BILITOT 0.3 03/28/2020   Lab Results  Component Value Date   HGBA1C 5.7 (H) 03/28/2020   HGBA1C 5.5 12/09/2017   HGBA1C 5.8 (H) 05/31/2014   Lab Results  Component Value Date   INSULIN 28.7 (H) 03/28/2020   Lab Results  Component Value Date   TSH 1.360 03/28/2020   Lab Results  Component Value Date   CHOL 209 (H) 03/28/2020   HDL 58 03/28/2020   LDLCALC 140 (H) 03/28/2020   TRIG 64 03/28/2020   CHOLHDL 2.5 12/09/2017   Lab Results  Component Value Date   WBC 6.9 03/28/2020   HGB 15.7 03/28/2020   HCT 46.6 03/28/2020   MCV 92 03/28/2020   PLT 249 03/28/2020   No results found for: IRON, TIBC, FERRITIN  Attestation Statements:   Reviewed by clinician on day of visit: allergies, medications, problem list, medical history, surgical history, family history, social history, and previous encounter notes.  Time spent on visit including pre-visit chart review and post-visit charting and care was 25 minutes.   I, Marianna Payment, am acting as Energy manager for [employer], NP-C   I have reviewed the above documentation for accuracy and completeness, and I agree with the above. -  Julaine Fusi, NP

## 2020-04-29 ENCOUNTER — Other Ambulatory Visit (INDEPENDENT_AMBULATORY_CARE_PROVIDER_SITE_OTHER): Payer: Self-pay | Admitting: Family Medicine

## 2020-04-29 DIAGNOSIS — R7303 Prediabetes: Secondary | ICD-10-CM

## 2020-05-02 ENCOUNTER — Encounter (INDEPENDENT_AMBULATORY_CARE_PROVIDER_SITE_OTHER): Payer: Self-pay

## 2020-05-02 NOTE — Telephone Encounter (Signed)
My chart message sent to pt.

## 2020-05-10 ENCOUNTER — Ambulatory Visit (INDEPENDENT_AMBULATORY_CARE_PROVIDER_SITE_OTHER): Payer: Managed Care, Other (non HMO) | Admitting: Adult Health

## 2020-05-17 ENCOUNTER — Encounter (INDEPENDENT_AMBULATORY_CARE_PROVIDER_SITE_OTHER): Payer: Self-pay | Admitting: Adult Health

## 2020-05-17 ENCOUNTER — Other Ambulatory Visit: Payer: Self-pay

## 2020-05-17 ENCOUNTER — Ambulatory Visit (INDEPENDENT_AMBULATORY_CARE_PROVIDER_SITE_OTHER): Payer: Managed Care, Other (non HMO) | Admitting: Adult Health

## 2020-05-17 VITALS — BP 118/70 | HR 83 | Temp 98.2°F | Ht 68.0 in | Wt 203.0 lb

## 2020-05-17 DIAGNOSIS — E669 Obesity, unspecified: Secondary | ICD-10-CM

## 2020-05-17 DIAGNOSIS — R7303 Prediabetes: Secondary | ICD-10-CM | POA: Diagnosis not present

## 2020-05-17 DIAGNOSIS — E559 Vitamin D deficiency, unspecified: Secondary | ICD-10-CM | POA: Diagnosis not present

## 2020-05-17 DIAGNOSIS — Z9189 Other specified personal risk factors, not elsewhere classified: Secondary | ICD-10-CM

## 2020-05-17 DIAGNOSIS — Z6831 Body mass index (BMI) 31.0-31.9, adult: Secondary | ICD-10-CM | POA: Diagnosis not present

## 2020-05-17 MED ORDER — METFORMIN HCL 500 MG PO TABS: ORAL_TABLET | ORAL | 0 refills | Status: DC

## 2020-05-22 NOTE — Progress Notes (Signed)
Chief Complaint:   OBESITY Adam Adams is here to discuss his progress with his obesity treatment plan along with follow-up of his obesity related diagnoses. Adam Adams is on keeping a food journal and adhering to recommended goals of 1800-2000 calories and 120 grams protein and states he is following his eating plan approximately 70% of the time. Adam Adams states he is exercising for 0 minutes 0 times per week.  Today's visit was #: 4 Starting weight: 203 lbs Starting date: 03/28/2020 Today's weight: 203 lbs Today's date: 05/17/2020 Total lbs lost to date: 0 Total lbs lost since last in-office visit: 1 lb  Interim History: Adam Adams has yet to use "My Fitness Pal".  He has been decreasing his CHO/sugar intake and making healthiest food choices possible.  He has been using his own personal food scale - provided HWW scale today.  He reports eating his first meal of the day usually noon-2pm each day.  Subjective:   1. Prediabetes Adam Adams has a diagnosis of prediabetes based on his elevated HgA1c and was informed this puts him at greater risk of developing diabetes. He continues to work on diet and exercise to decrease his risk of diabetes. He denies nausea or hypoglycemia.  At last check, A1c and insulin levels were both elevated.  He reports polyphagia in late afternoon and early evening.  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  Lab Results  Component Value Date   HGBA1C 5.7 (H) 03/28/2020   Lab Results  Component Value Date   INSULIN 28.7 (H) 03/28/2020   2. Vitamin D deficiency Adam Adams's Vitamin D level was 30.7 on 03/28/2020. He is currently taking prescription vitamin D 50,000 IU each week. He denies nausea, vomiting or muscle weakness.    3. At risk for diabetes mellitus Adam Adams is at higher than average risk for developing diabetes due to his obesity and prediabetes.  Assessment/Plan:   1. Prediabetes Adam Adams will continue to work on weight loss, exercise, and  decreasing simple carbohydrates to help decrease the risk of diabetes.  Change metformin to 500 mg 1/2 tablet with breakfast, 1/2 tablet with lunch.  Refill sent.  Increase protein.  Continue to reduce CHO/sugar intake.  - Refill metFORMIN (GLUCOPHAGE) 500 MG tablet; 1/2 tablet with breakfast, 1/2 tablet with lunch  Dispense: 30 tablet; Refill: 0  2. Vitamin D deficiency Low Vitamin D level contributes to fatigue and are associated with obesity, breast, and colon cancer. He agrees to continue to take prescription Vitamin D @50 ,000 IU every week and will follow-up for routine testing of Vitamin D, at least 2-3 times per year to avoid over-replacement.  3. At risk for diabetes mellitus Adam Adams was given approximately 15 minutes of diabetes education and counseling today. We discussed intensive lifestyle modifications today with an emphasis on weight loss as well as increasing exercise and decreasing simple carbohydrates in his diet. We also reviewed medication options with an emphasis on risk versus benefit of those discussed.   Repetitive spaced learning was employed today to elicit superior memory formation and behavioral change.  4. Class 1 obesity with serious comorbidity and body mass index (BMI) of 31.0 to 31.9 in adult, unspecified obesity type Adam Adams is currently in the action stage of change. As such, his goal is to continue with weight loss efforts. He has agreed to keeping a food journal and adhering to recommended goals of 1800-2000 calories and 120 grams of protein.   Exercise goals: No exercise has been prescribed  at this time.  Behavioral modification strategies: increasing lean protein intake, increasing water intake, no skipping meals, meal planning and cooking strategies, planning for success and keeping a strict food journal.  Adam Adams has agreed to follow-up with our clinic in 2 weeks. He was informed of the importance of frequent follow-up visits to maximize his success with intensive  lifestyle modifications for his multiple health conditions.   Objective:   Blood pressure 118/70, pulse 83, temperature 98.2 F (36.8 C), temperature source Oral, height 5\' 8"  (1.727 m), weight 203 lb (92.1 kg), SpO2 96 %. Body mass index is 30.87 kg/m.  General: Cooperative, alert, well developed, in no acute distress. HEENT: Conjunctivae and lids unremarkable. Cardiovascular: Regular rhythm.  Lungs: Normal work of breathing. Neurologic: No focal deficits.   Lab Results  Component Value Date   CREATININE 0.84 03/28/2020   BUN 15 03/28/2020   NA 141 03/28/2020   K 4.4 03/28/2020   CL 106 03/28/2020   CO2 23 03/28/2020   Lab Results  Component Value Date   ALT 38 03/28/2020   AST 25 03/28/2020   ALKPHOS 65 03/28/2020   BILITOT 0.3 03/28/2020   Lab Results  Component Value Date   HGBA1C 5.7 (H) 03/28/2020   HGBA1C 5.5 12/09/2017   HGBA1C 5.8 (H) 05/31/2014   Lab Results  Component Value Date   INSULIN 28.7 (H) 03/28/2020   Lab Results  Component Value Date   TSH 1.360 03/28/2020   Lab Results  Component Value Date   CHOL 209 (H) 03/28/2020   HDL 58 03/28/2020   LDLCALC 140 (H) 03/28/2020   TRIG 64 03/28/2020   CHOLHDL 2.5 12/09/2017   Lab Results  Component Value Date   WBC 6.9 03/28/2020   HGB 15.7 03/28/2020   HCT 46.6 03/28/2020   MCV 92 03/28/2020   PLT 249 03/28/2020   Attestation Statements:   Reviewed by clinician on day of visit: allergies, medications, problem list, medical history, surgical history, family history, social history, and previous encounter notes.  I, 03/30/2020, CMA, am acting as Insurance claims handler for Energy manager, NP.  I have reviewed the above documentation for accuracy and completeness, and I agree with the above. - William Hamburger, NP

## 2020-05-23 ENCOUNTER — Other Ambulatory Visit: Payer: Self-pay | Admitting: Sports Medicine

## 2020-06-01 ENCOUNTER — Other Ambulatory Visit: Payer: Self-pay

## 2020-06-01 ENCOUNTER — Ambulatory Visit (INDEPENDENT_AMBULATORY_CARE_PROVIDER_SITE_OTHER): Payer: Managed Care, Other (non HMO) | Admitting: Adult Health

## 2020-06-01 ENCOUNTER — Encounter (INDEPENDENT_AMBULATORY_CARE_PROVIDER_SITE_OTHER): Payer: Self-pay | Admitting: Adult Health

## 2020-06-01 VITALS — BP 113/56 | HR 74 | Temp 97.7°F | Ht 68.0 in | Wt 202.0 lb

## 2020-06-01 DIAGNOSIS — Z9189 Other specified personal risk factors, not elsewhere classified: Secondary | ICD-10-CM

## 2020-06-01 DIAGNOSIS — E559 Vitamin D deficiency, unspecified: Secondary | ICD-10-CM

## 2020-06-01 DIAGNOSIS — E669 Obesity, unspecified: Secondary | ICD-10-CM

## 2020-06-01 DIAGNOSIS — R7303 Prediabetes: Secondary | ICD-10-CM

## 2020-06-01 DIAGNOSIS — Z6830 Body mass index (BMI) 30.0-30.9, adult: Secondary | ICD-10-CM

## 2020-06-01 MED ORDER — VITAMIN D (ERGOCALCIFEROL) 1.25 MG (50000 UNIT) PO CAPS: 50000.0000 [IU] | ORAL_CAPSULE | ORAL | 0 refills | Status: DC

## 2020-06-05 NOTE — Progress Notes (Signed)
Chief Complaint:   OBESITY Adam Adams is here to discuss his progress with his obesity treatment plan along with follow-up of his obesity related diagnoses. Adam Adams is keeping a food journal and adhering to recommended goals of 1800-2000 calories and 120 grams of protein and states he is following his eating plan approximately 75% of the time. Adam Adams states he is exercising 0 minutes 0 times per week.  Today's visit was #: 5 Starting weight: 203 lbs Starting date: 03/28/2020 Today's weight: 202 lbs Today's date: 06/01/2020 Total lbs lost to date: 1 Total lbs lost since last in-office visit: 1  Interim History: Adam Adams' wife has been preparing all of his meals and tracking his calorie and protein intake. He will consume less than 1800 calories a day approximately 2-3 days a week. He has really been focusing on increased protein intake at meals and reports decreased carbohydrate cravings since starting metformin.  Subjective:   Vitamin D deficiency. Adam Adams is on Ergocalciferol. No nausea, vomiting, or muscle weakness.    Ref. Range 03/28/2020 12:25  Vitamin D, 25-Hydroxy Latest Ref Range: 30.0 - 100.0 ng/mL 30.7   Prediabetes. Adam Adams has a diagnosis of prediabetes based on his elevated HgA1c and was informed this puts him at greater risk of developing diabetes. He continues to work on diet and exercise to decrease his risk of diabetes. He denies nausea or hypoglycemia. 03/28/2020 blood glucose 93, A1c 56.7 with an insulin level of 28.7. Adam Adams has started metformin 500 mg 1/2 tablet at breakfast and 1/2 tablet at lunch. He denies GI upset and reports decreased carbohydrate cravings.  Lab Results  Component Value Date   HGBA1C 5.7 (H) 03/28/2020   Lab Results  Component Value Date   INSULIN 28.7 (H) 03/28/2020   At risk for diabetes mellitus. Adam Adams is at higher than average risk for developing diabetes due to prediabetes and obesity - is on low dose metformin.  Assessment/Plan:    Vitamin D deficiency. Low Vitamin D level contributes to fatigue and are associated with obesity, breast, and colon cancer. He was given a refill on his Vitamin D, Ergocalciferol, (DRISDOL) 1.25 MG (50000 UNIT) CAPS capsule every week #4 with 0 refills and will follow-up for routine testing of Vitamin D at the end of October 2021.   Prediabetes. Adam Adams will continue to work on weight loss, exercise, and decreasing simple carbohydrates to help decrease the risk of diabetes. He will have labs at the end of October 2021.  At risk for diabetes mellitus. Adam Adams was given approximately 15 minutes of diabetes education and counseling today. We discussed intensive lifestyle modifications today with an emphasis on weight loss as well as increasing exercise and decreasing simple carbohydrates in his diet. We also reviewed medication options with an emphasis on risk versus benefit of those discussed.   Repetitive spaced learning was employed today to elicit superior memory formation and behavioral change.  Class 1 obesity with serious comorbidity and body mass index (BMI) of 30.0 to 30.9 in adult, unspecified obesity type.  Adam Adams is currently in the action stage of change. As such, his goal is to continue with weight loss efforts. He has agreed to keeping a food journal and adhering to recommended goals of 1800-2000 calories and 120 grams of protein daily. He will use My Fitness Pal to track progress.  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  Behavioral modification strategies: increasing lean protein intake, meal planning and cooking strategies and planning  for success.  Adam Adams has agreed to follow-up with our clinic in 2 weeks. He was informed of the importance of frequent follow-up visits to maximize his success with intensive lifestyle modifications for his multiple health conditions.   Objective:   Blood pressure (!) 113/56, pulse 74, temperature 97.7 F (36.5 C),  height 5\' 8"  (1.727 m), weight 202 lb (91.6 kg), SpO2 97 %. Body mass index is 30.71 kg/m.  General: Cooperative, alert, well developed, in no acute distress. HEENT: Conjunctivae and lids unremarkable. Cardiovascular: Regular rhythm.  Lungs: Normal work of breathing. Neurologic: No focal deficits.   Lab Results  Component Value Date   CREATININE 0.84 03/28/2020   BUN 15 03/28/2020   NA 141 03/28/2020   K 4.4 03/28/2020   CL 106 03/28/2020   CO2 23 03/28/2020   Lab Results  Component Value Date   ALT 38 03/28/2020   AST 25 03/28/2020   ALKPHOS 65 03/28/2020   BILITOT 0.3 03/28/2020   Lab Results  Component Value Date   HGBA1C 5.7 (H) 03/28/2020   HGBA1C 5.5 12/09/2017   HGBA1C 5.8 (H) 05/31/2014   Lab Results  Component Value Date   INSULIN 28.7 (H) 03/28/2020   Lab Results  Component Value Date   TSH 1.360 03/28/2020   Lab Results  Component Value Date   CHOL 209 (H) 03/28/2020   HDL 58 03/28/2020   LDLCALC 140 (H) 03/28/2020   TRIG 64 03/28/2020   CHOLHDL 2.5 12/09/2017   Lab Results  Component Value Date   WBC 6.9 03/28/2020   HGB 15.7 03/28/2020   HCT 46.6 03/28/2020   MCV 92 03/28/2020   PLT 249 03/28/2020   No results found for: IRON, TIBC, FERRITIN  Attestation Statements:   Reviewed by clinician on day of visit: allergies, medications, problem list, medical history, surgical history, family history, social history, and previous encounter notes.  I, 03/30/2020, am acting as Marianna Payment for Energy manager, NP-C   I have reviewed the above documentation for accuracy and completeness, and I agree with the above. -  [employer], NP

## 2020-06-15 ENCOUNTER — Ambulatory Visit: Payer: Managed Care, Other (non HMO) | Admitting: Sports Medicine

## 2020-06-15 ENCOUNTER — Ambulatory Visit (INDEPENDENT_AMBULATORY_CARE_PROVIDER_SITE_OTHER): Payer: Managed Care, Other (non HMO)

## 2020-06-15 ENCOUNTER — Other Ambulatory Visit: Payer: Self-pay

## 2020-06-15 DIAGNOSIS — M2550 Pain in unspecified joint: Secondary | ICD-10-CM | POA: Diagnosis not present

## 2020-06-15 DIAGNOSIS — M79672 Pain in left foot: Secondary | ICD-10-CM | POA: Diagnosis not present

## 2020-06-15 DIAGNOSIS — M797 Fibromyalgia: Secondary | ICD-10-CM | POA: Insufficient documentation

## 2020-06-15 DIAGNOSIS — M79671 Pain in right foot: Secondary | ICD-10-CM

## 2020-06-15 MED ORDER — GABAPENTIN 300 MG PO CAPS: ORAL_CAPSULE | ORAL | 3 refills | Status: DC

## 2020-06-15 NOTE — Assessment & Plan Note (Signed)
Adam Adams is a pleasant 29 year old male, he recalls widespread aches and pains throughout his life, he did receive a diagnosis of fibromyalgia from physicians in [place] where he grew up. Today he is complaining of pain in his wrist, acromioclavicular joint, both feet. He occasionally gets it in his neck and shoulders. He does have peptic ulcer disease so he is unable to take NSAIDs. 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 I think we need a better plan for him, we need a more long-term solution, less invasive. I am going to do a full rheumatoid testing, we are also going to add gabapentin for him to take in an up taper. I would like x-rays of his feet, he is also hurting over his tarsometatarsal joints. Return to see me in approximately 4 weeks.

## 2020-06-15 NOTE — Progress Notes (Signed)
    Procedures performed today:    None.  Independent interpretation of notes and tests performed by another provider:   None.  Brief History, Exam, Impression, and Recommendations:    Polyarthralgia Adam Adams is a pleasant 29 year old male, he recalls widespread aches and pains throughout his life, he did receive a diagnosis of fibromyalgia from physicians in [place] where he grew up. Today he is complaining of pain in his wrist, acromioclavicular joint, both feet. He occasionally gets it in his neck and shoulders. He does have peptic ulcer disease so he is unable to take NSAIDs. 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 I think we need a better plan for him, we need a more long-term solution, less invasive. I am going to do a full rheumatoid testing, we are also going to add gabapentin for him to take in an up taper. I would like x-rays of his feet, he is also hurting over his tarsometatarsal joints. Return to see me in approximately 4 weeks.    ___________________________________________ Adam Adams. Adam Adams, M.D., ABFM., CAQSM. Primary Care and Sports Medicine [facility]  Adjunct Instructor of Family Medicine  [employer]

## 2020-06-19 ENCOUNTER — Other Ambulatory Visit: Payer: Self-pay

## 2020-06-19 ENCOUNTER — Encounter (INDEPENDENT_AMBULATORY_CARE_PROVIDER_SITE_OTHER): Payer: Self-pay | Admitting: Adult Health

## 2020-06-19 ENCOUNTER — Ambulatory Visit (INDEPENDENT_AMBULATORY_CARE_PROVIDER_SITE_OTHER): Payer: Managed Care, Other (non HMO) | Admitting: Adult Health

## 2020-06-19 VITALS — BP 120/72 | HR 64 | Temp 97.7°F | Ht 68.0 in | Wt 207.0 lb

## 2020-06-19 DIAGNOSIS — Z6831 Body mass index (BMI) 31.0-31.9, adult: Secondary | ICD-10-CM | POA: Diagnosis not present

## 2020-06-19 DIAGNOSIS — E559 Vitamin D deficiency, unspecified: Secondary | ICD-10-CM | POA: Diagnosis not present

## 2020-06-19 DIAGNOSIS — E669 Obesity, unspecified: Secondary | ICD-10-CM

## 2020-06-19 DIAGNOSIS — R7303 Prediabetes: Secondary | ICD-10-CM | POA: Diagnosis not present

## 2020-06-19 DIAGNOSIS — Z9189 Other specified personal risk factors, not elsewhere classified: Secondary | ICD-10-CM | POA: Diagnosis not present

## 2020-06-19 LAB — CBC WITH DIFFERENTIAL/PLATELET
Absolute Monocytes: 676 cells/uL (ref 200–950)
Basophils Absolute: 68 cells/uL (ref 0–200)
Basophils Relative: 1.1 %
Eosinophils Absolute: 136 cells/uL (ref 15–500)
Eosinophils Relative: 2.2 %
HCT: 45.4 % (ref 38.5–50.0)
Hemoglobin: 15.6 g/dL (ref 13.2–17.1)
Lymphs Abs: 2015 cells/uL (ref 850–3900)
MCH: 32 pg (ref 27.0–33.0)
MCHC: 34.4 g/dL (ref 32.0–36.0)
MCV: 93 fL (ref 80.0–100.0)
MPV: 9.7 fL (ref 7.5–12.5)
Monocytes Relative: 10.9 %
Neutro Abs: 3305 cells/uL (ref 1500–7800)
Neutrophils Relative %: 53.3 %
Platelets: 351 10*3/uL (ref 140–400)
RBC: 4.88 10*6/uL (ref 4.20–5.80)
RDW: 12.3 % (ref 11.0–15.0)
Total Lymphocyte: 32.5 %
WBC: 6.2 10*3/uL (ref 3.8–10.8)

## 2020-06-19 LAB — LUPUS(12) PANEL
Anti Nuclear Antibody (ANA): POSITIVE — AB
C3 Complement: 144 mg/dL (ref 82–185)
C4 Complement: 29 mg/dL (ref 15–53)
ENA SM Ab Ser-aCnc: <1 AI
Rheumatoid fact SerPl-aCnc: <14 IU/mL (ref <14)
Ribosomal P Protein Ab: <1 AI
SM/RNP: <1 AI
SSA (Ro) (ENA) Antibody, IgG: <1 AI
SSB (La) (ENA) Antibody, IgG: <1 AI
Scleroderma (Scl-70) (ENA) Antibody, IgG: <1 AI
Thyroperoxidase Ab SerPl-aCnc: 1 IU/mL (ref <9)
ds DNA Ab: <1 IU/mL

## 2020-06-19 LAB — COMPREHENSIVE METABOLIC PANEL
AG Ratio: 1.8 (calc) (ref 1.0–2.5)
ALT: 49 U/L — ABNORMAL HIGH (ref 9–46)
AST: 29 U/L (ref 10–40)
Albumin: 4.7 g/dL (ref 3.6–5.1)
Alkaline phosphatase (APISO): 57 U/L (ref 36–130)
BUN: 21 mg/dL (ref 7–25)
CO2: 26 mmol/L (ref 20–32)
Calcium: 9.7 mg/dL (ref 8.6–10.3)
Chloride: 104 mmol/L (ref 98–110)
Creat: 0.9 mg/dL (ref 0.60–1.35)
Globulin: 2.6 g/dL (calc) (ref 1.9–3.7)
Glucose, Bld: 96 mg/dL (ref 65–139)
Potassium: 4.5 mmol/L (ref 3.5–5.3)
Sodium: 140 mmol/L (ref 135–146)
Total Bilirubin: 0.5 mg/dL (ref 0.2–1.2)
Total Protein: 7.3 g/dL (ref 6.1–8.1)

## 2020-06-19 LAB — SEDIMENTATION RATE: Sed Rate: 6 mm/h (ref 0–15)

## 2020-06-19 LAB — RHEUMATOID FACTOR (IGA, IGG, IGM)
Rheumatoid Factor (IgA): <5 U (ref <=6)
Rheumatoid Factor (IgG): <5 U (ref <=6)
Rheumatoid Factor (IgM): <5 U (ref <=6)

## 2020-06-19 LAB — CYCLIC CITRUL PEPTIDE ANTIBODY, IGG: Cyclic Citrullin Peptide Ab: <16 UNITS

## 2020-06-19 LAB — ANTI-NUCLEAR AB-TITER (ANA TITER): ANA Titer 1: 1:40 {titer} — ABNORMAL HIGH

## 2020-06-19 LAB — URIC ACID: Uric Acid, Serum: 6 mg/dL (ref 4.0–8.0)

## 2020-06-19 LAB — HLA-B27 ANTIGEN: HLA-B27 Antigen: NEGATIVE

## 2020-06-19 LAB — TSH: TSH: 0.78 mIU/L (ref 0.40–4.50)

## 2020-06-19 MED ORDER — VITAMIN D (ERGOCALCIFEROL) 1.25 MG (50000 UNIT) PO CAPS: 50000.0000 [IU] | ORAL_CAPSULE | ORAL | 0 refills | Status: DC

## 2020-06-20 NOTE — Progress Notes (Addendum)
Chief Complaint:   OBESITY Adam Adams is here to discuss his progress with his obesity treatment plan along with follow-up of his obesity related diagnoses. Adam Adams is keeping a food journal and adhering to recommended goals of 1800-2000 calories and 120 grams of protein and states he is following his eating plan approximately 75% of the time. Adam Adams states he is doing cardio 30 minutes 7 times per week.  Today's visit was #: 6 Starting weight: 203 lbs Starting date: 03/28/2020 Today's weight: 207 lbs Today's date: 06/19/2020 Total lbs lost to date: 0 Total lbs lost since last in-office visit: 0  Interim History: Adam Adams was seen by Sports Medicine for polyarthralgia- started on gabapentin and is being worked up for rheumatoid arthritis. Due to PUD, he is unable to use NSAID's for pain control. He will follow-up with Sports Medicine in 4 weeks. He feels that he hits his journaling goals 75% of the time during the week and 50% of the time during the weekend.  Subjective:   Prediabetes. Adam Adams has a diagnosis of prediabetes based on his elevated HgA1c and was informed this puts him at greater risk of developing diabetes. He continues to work on diet and exercise to decrease his risk of diabetes. He denies nausea or hypoglycemia. 03/28/2020 blood glucose 93, A1c 5.7 with an insulin level of 28.7. Adam Adams reports taking metformin 500 mg 1/2 tab BID, which he is tolerating well.  Lab Results  Component Value Date   HGBA1C 5.7 (H) 03/28/2020   Lab Results  Component Value Date   INSULIN 28.7 (H) 03/28/2020   Vitamin D deficiency. 03/28/2020 Vitamin D level 30.7, which is below goal of 50. Adam Adams is on Ergocalciferol. No nausea, vomiting, or muscle weakness.    Ref. Range 03/28/2020 12:25  Vitamin D, 25-Hydroxy Latest Ref Range: 30.0 - 100.0 ng/mL 30.7   At risk for diabetes mellitus. Adam Adams is at higher than average risk for developing diabetes due to prediabetes and obesity.    Assessment/Plan:   Prediabetes. Adam Adams will continue to work on weight loss, exercise, and decreasing simple carbohydrates to help decrease the risk of diabetes. He will continue metformin as directed and will increase protein intake. Labs will be checked at his next office visit.  Depending on labs and response to journaling plan- adjustments to Metformin and/or addition of GLP-1 will be discussed. Focus on following eating plan is emphasized.   Vitamin D deficiency. Low Vitamin D level contributes to fatigue and are associated with obesity, breast, and colon cancer. He was given a refill on his Vitamin D, Ergocalciferol, (DRISDOL) 1.25 MG (50000 UNIT) CAPS capsule every week #4 with 0 refills and will follow-up for routine testing of Vitamin D at his next office visit.   At risk for diabetes mellitus. Adam Adams was given approximately 15 minutes of diabetes education and counseling today. We discussed intensive lifestyle modifications today with an emphasis on weight loss as well as increasing exercise and decreasing simple carbohydrates in his diet. We also reviewed medication options with an emphasis on risk versus benefit of those discussed.   Repetitive spaced learning was employed today to elicit superior memory formation and behavioral change.  Class 1 obesity with serious comorbidity and body mass index (BMI) of 31.0 to 31.9 in adult, unspecified obesity type.  Adam Adams is currently in the action stage of change. As such, his goal is to continue with weight loss efforts. He has agreed to keeping a food journal and adhering to recommended goals  of 1800-2000 calories and 120 grams of protein daily (okay to use Mediterranean foods to hit goals). He will strive to hit journaling goal greater than 80% everyday.  Exercise goals: Adam Adams will continue walking the dog and doing cardio 30 minutes 7 times per week.  Behavioral modification strategies: increasing lean protein intake, meal planning and  cooking strategies and planning for success.  Adam Adams has agreed to follow-up with our clinic fasting in 2 weeks. He was informed of the importance of frequent follow-up visits to maximize his success with intensive lifestyle modifications for his multiple health conditions.   Objective:   Blood pressure 120/72, pulse 64, temperature 97.7 F (36.5 C), height 5\' 8"  (1.727 m), weight 207 lb (93.9 kg), SpO2 100 %. Body mass index is 31.47 kg/m.  General: Cooperative, alert, well developed, in no acute distress. HEENT: Conjunctivae and lids unremarkable. Cardiovascular: Regular rhythm.  Lungs: Normal work of breathing. Neurologic: No focal deficits.   Lab Results  Component Value Date   CREATININE 0.90 06/15/2020   BUN 21 06/15/2020   NA 140 06/15/2020   K 4.5 06/15/2020   CL 104 06/15/2020   CO2 26 06/15/2020   Lab Results  Component Value Date   ALT 49 (H) 06/15/2020   AST 29 06/15/2020   ALKPHOS 65 03/28/2020   BILITOT 0.5 06/15/2020   Lab Results  Component Value Date   HGBA1C 5.7 (H) 03/28/2020   HGBA1C 5.5 12/09/2017   HGBA1C 5.8 (H) 05/31/2014   Lab Results  Component Value Date   INSULIN 28.7 (H) 03/28/2020   Lab Results  Component Value Date   TSH 0.78 06/15/2020   Lab Results  Component Value Date   CHOL 209 (H) 03/28/2020   HDL 58 03/28/2020   LDLCALC 140 (H) 03/28/2020   TRIG 64 03/28/2020   CHOLHDL 2.5 12/09/2017   Lab Results  Component Value Date   WBC 6.2 06/15/2020   HGB 15.6 06/15/2020   HCT 45.4 06/15/2020   MCV 93.0 06/15/2020   PLT 351 06/15/2020   No results found for: IRON, TIBC, FERRITIN  Attestation Statements:   Reviewed by clinician on day of visit: allergies, medications, problem list, medical history, surgical history, family history, social history, and previous encounter notes.  I, 06/17/2020, am acting as Marianna Payment for Energy manager, NP-C   I have reviewed the above documentation for accuracy and completeness, and  I agree with the above. -  Leinani Lisbon d. Graceann Boileau, NP-C

## 2020-07-02 ENCOUNTER — Other Ambulatory Visit (INDEPENDENT_AMBULATORY_CARE_PROVIDER_SITE_OTHER): Payer: Self-pay | Admitting: Adult Health

## 2020-07-02 DIAGNOSIS — E559 Vitamin D deficiency, unspecified: Secondary | ICD-10-CM

## 2020-07-04 ENCOUNTER — Encounter (INDEPENDENT_AMBULATORY_CARE_PROVIDER_SITE_OTHER): Payer: Self-pay | Admitting: Adult Health

## 2020-07-04 ENCOUNTER — Ambulatory Visit (INDEPENDENT_AMBULATORY_CARE_PROVIDER_SITE_OTHER): Payer: Managed Care, Other (non HMO) | Admitting: Adult Health

## 2020-07-04 ENCOUNTER — Other Ambulatory Visit: Payer: Self-pay

## 2020-07-04 VITALS — BP 122/67 | HR 66 | Temp 97.6°F | Ht 68.0 in | Wt 208.0 lb

## 2020-07-04 DIAGNOSIS — Z6831 Body mass index (BMI) 31.0-31.9, adult: Secondary | ICD-10-CM

## 2020-07-04 DIAGNOSIS — Z9189 Other specified personal risk factors, not elsewhere classified: Secondary | ICD-10-CM | POA: Diagnosis not present

## 2020-07-04 DIAGNOSIS — M2550 Pain in unspecified joint: Secondary | ICD-10-CM

## 2020-07-04 DIAGNOSIS — E669 Obesity, unspecified: Secondary | ICD-10-CM

## 2020-07-04 DIAGNOSIS — E785 Hyperlipidemia, unspecified: Secondary | ICD-10-CM | POA: Insufficient documentation

## 2020-07-04 DIAGNOSIS — R7303 Prediabetes: Secondary | ICD-10-CM | POA: Diagnosis not present

## 2020-07-04 DIAGNOSIS — E782 Mixed hyperlipidemia: Secondary | ICD-10-CM | POA: Insufficient documentation

## 2020-07-04 DIAGNOSIS — E559 Vitamin D deficiency, unspecified: Secondary | ICD-10-CM

## 2020-07-04 MED ORDER — METFORMIN HCL 500 MG PO TABS: ORAL_TABLET | ORAL | 0 refills | Status: DC

## 2020-07-04 NOTE — Progress Notes (Signed)
Chief Complaint:   OBESITY Adam Adams is here to discuss his progress with his obesity treatment plan along with follow-up of his obesity related diagnoses. Adam Adams is keeping a food journal and adhering to recommended goals of 1800-2000 calories and 120 grams of protein and states he is following his eating plan approximately 80% of the time. Adam Adams states he is walking/running 30-45 minutes 7 times per week.  Today's visit was #: 7 Starting weight: 203 lbs Starting date: 03/28/2020 Today's weight: 208 lbs Today's date: 07/04/2020 Total lbs lost to date: 0 Total lbs lost since last in-office visit: 0  Interim History: Adam Adams feels that he is tracking and hitting his calorie and protein goals 100% Monday thru Friday. He will often sleep in late on weekends and not be very hungry, which will make it difficult for him to consume all calories and protein on the plan Saturday and Sunday.  He is not tracking intake on weekends.  Subjective:   Vitamin D deficiency. Vitamin D level on 03/28/2020 was 30.7, below goal of 50. Adam Adams is on Ergocalciferol. No nausea, vomiting, or muscle weakness.    Ref. Range 03/28/2020 12:25  Vitamin D, 25-Hydroxy Latest Ref Range: 30.0 - 100.0 ng/mL 30.7   Prediabetes. Adam Adams has a diagnosis of prediabetes based on his elevated HgA1c and was informed this puts him at greater risk of developing diabetes. He continues to work on diet and exercise to decrease his risk of diabetes. He denies nausea or hypoglycemia. 03/28/2020 A1c 5.7 with an insulin level of 28.7. Adam Adams is on metformin 500 mg 1/2 tab with breakfast and 1/2 tab with lunch. He denies GI upset.  Lab Results  Component Value Date   HGBA1C 5.7 (H) 03/28/2020   Lab Results  Component Value Date   INSULIN 28.7 (H) 03/28/2020   Mixed hyperlipidemia. Adam Adams has hyperlipidemia and has been trying to improve his cholesterol levels with intensive lifestyle modification including a low saturated fat diet,  exercise and weight loss. He denies any chest pain, claudication or myalgias. 03/28/2020 total and LDL cholesterol levels were both above goal. Adam Adams is not on a statin and denies cardiac symptoms.  Lab Results  Component Value Date   ALT 49 (H) 06/15/2020   AST 29 06/15/2020   ALKPHOS 65 03/28/2020   BILITOT 0.5 06/15/2020   Lab Results  Component Value Date   CHOL 209 (H) 03/28/2020   HDL 58 03/28/2020   LDLCALC 140 (H) 03/28/2020   TRIG 64 03/28/2020   CHOLHDL 2.5 12/09/2017   Polyarthralgia. Due to sedation, Adam Adams has been unable to titrate up on Gabapentin as directed. He is only taking 300 mg QHS. He reports reduction in pain with the daily dose.  At risk for heart disease. Adam Adams is at a higher than average risk for cardiovascular disease due to hyperlipidemia and obesity.   Assessment/Plan:   Vitamin D deficiency. Low Vitamin D level contributes to fatigue and are associated with obesity, breast, and colon cancer. He was given a refill on his VITAMIN D 25 Hydroxy (Vit-D Deficiency, Fractures) every week #4 with 0 refills and Vitamin D level will be checked today.  Prediabetes. Adam Adams will continue to work on weight loss, exercise, and decreasing simple carbohydrates to help decrease the risk of diabetes. Refill was given for metFORMIN (GLUCOPHAGE) 500 MG tablet 1/2 tab with breakfast and with lunch. Labs will be checked today.   Mixed hyperlipidemia. Cardiovascular risk and specific lipid/LDL goals reviewed.  We discussed several  lifestyle modifications today and Adam Adams will continue to work on diet, exercise and weight loss efforts. Orders and follow up as documented in patient record. Labs will be checked today.   Counseling Intensive lifestyle modifications are the first line treatment for this issue.  Dietary changes: Increase soluble fiber. Decrease simple carbohydrates.  Exercise changes: Moderate to vigorous-intensity aerobic activity 150 minutes per week if  tolerated.  Lipid-lowering medications: see documented in medical record.  Polyarthralgia. Adam Adams will follow-up with his PCP about Gabapentin.  At risk for heart disease. Adam Adams was given approximately 15 minutes of coronary artery disease prevention counseling today. He is 29 y.o. male and has risk factors for heart disease including obesity. We discussed intensive lifestyle modifications today with an emphasis on specific weight loss instructions and strategies.   Repetitive spaced learning was employed today to elicit superior memory formation and behavioral change.  Class 1 obesity with serious comorbidity and body mass index (BMI) of 31.0 to 31.9 in adult, unspecified obesity type.  Adam Adams is currently in the action stage of change. As such, his goal is to continue with weight loss efforts. He has agreed to keeping a food journal and adhering to recommended goals of 1800-2000 calories and 120 grams of protein daily.   Exercise goals: Adam Adams will continue walking/running 30-45 minutes 7 times per week.  Behavioral modification strategies: increasing lean protein intake, increasing water intake, no skipping meals, meal planning and cooking strategies, planning for success and keeping a strict food journal.  Adam Adams has agreed to follow-up with our clinic in 2 weeks. He was informed of the importance of frequent follow-up visits to maximize his success with intensive lifestyle modifications for his multiple health conditions.   Adam Adams was informed we would discuss his lab results at his next visit unless there is a critical issue that needs to be addressed sooner. Adam Adams agreed to keep his next visit at the agreed upon time to discuss these results.  Objective:   Blood pressure 122/67, pulse 66, temperature 97.6 F (36.4 C), height 5\' 8"  (1.727 m), weight 208 lb (94.3 kg), SpO2 100 %. Body mass index is 31.63 kg/m.  General: Cooperative, alert, well developed, in no acute distress. HEENT:  Conjunctivae and lids unremarkable. Cardiovascular: Regular rhythm.  Lungs: Normal work of breathing. Neurologic: No focal deficits.   Lab Results  Component Value Date   CREATININE 0.90 06/15/2020   BUN 21 06/15/2020   NA 140 06/15/2020   K 4.5 06/15/2020   CL 104 06/15/2020   CO2 26 06/15/2020   Lab Results  Component Value Date   ALT 49 (H) 06/15/2020   AST 29 06/15/2020   ALKPHOS 65 03/28/2020   BILITOT 0.5 06/15/2020   Lab Results  Component Value Date   HGBA1C 5.7 (H) 03/28/2020   HGBA1C 5.5 12/09/2017   HGBA1C 5.8 (H) 05/31/2014   Lab Results  Component Value Date   INSULIN 28.7 (H) 03/28/2020   Lab Results  Component Value Date   TSH 0.78 06/15/2020   Lab Results  Component Value Date   CHOL 209 (H) 03/28/2020   HDL 58 03/28/2020   LDLCALC 140 (H) 03/28/2020   TRIG 64 03/28/2020   CHOLHDL 2.5 12/09/2017   Lab Results  Component Value Date   WBC 6.2 06/15/2020   HGB 15.6 06/15/2020   HCT 45.4 06/15/2020   MCV 93.0 06/15/2020   PLT 351 06/15/2020   No results found for: IRON, TIBC, FERRITIN  Attestation Statements:   Reviewed by  clinician on day of visit: allergies, medications, problem list, medical history, surgical history, family history, social history, and previous encounter notes.  I, Marianna Payment, am acting as Energy manager for [employer], NP-C   I have reviewed the above documentation for accuracy and completeness, and I agree with the above. -  Adella Manolis d. Carlin Mamone, NP-C

## 2020-07-05 LAB — LIPID PANEL
Chol/HDL Ratio: 3.6 ratio (ref 0.0–5.0)
Cholesterol, Total: 185 mg/dL (ref 100–199)
HDL: 51 mg/dL (ref >39)
LDL Chol Calc (NIH): 117 mg/dL — ABNORMAL HIGH (ref 0–99)
Triglycerides: 95 mg/dL (ref 0–149)
VLDL Cholesterol Cal: 17 mg/dL (ref 5–40)

## 2020-07-05 LAB — COMPREHENSIVE METABOLIC PANEL
ALT: 57 IU/L — ABNORMAL HIGH (ref 0–44)
AST: 34 IU/L (ref 0–40)
Albumin/Globulin Ratio: 1.9 (ref 1.2–2.2)
Albumin: 4.6 g/dL (ref 4.0–5.0)
Alkaline Phosphatase: 56 IU/L (ref 44–121)
BUN/Creatinine Ratio: 16 (ref 9–20)
BUN: 14 mg/dL (ref 6–20)
Bilirubin Total: 0.5 mg/dL (ref 0.0–1.2)
CO2: 25 mmol/L (ref 20–29)
Calcium: 9.4 mg/dL (ref 8.7–10.2)
Chloride: 104 mmol/L (ref 96–106)
Creatinine, Ser: 0.86 mg/dL (ref 0.76–1.27)
GFR calc Af Amer: 134 mL/min/{1.73_m2} (ref >59)
GFR calc non Af Amer: 116 mL/min/{1.73_m2} (ref >59)
Globulin, Total: 2.4 g/dL (ref 1.5–4.5)
Glucose: 96 mg/dL (ref 65–99)
Potassium: 4.5 mmol/L (ref 3.5–5.2)
Sodium: 139 mmol/L (ref 134–144)
Total Protein: 7 g/dL (ref 6.0–8.5)

## 2020-07-05 LAB — HEMOGLOBIN A1C
Est. average glucose Bld gHb Est-mCnc: 105 mg/dL
Hgb A1c MFr Bld: 5.3 % (ref 4.8–5.6)

## 2020-07-05 LAB — VITAMIN D 25 HYDROXY (VIT D DEFICIENCY, FRACTURES): Vit D, 25-Hydroxy: 42 ng/mL (ref 30.0–100.0)

## 2020-07-05 LAB — INSULIN, RANDOM: INSULIN: 19.1 u[IU]/mL (ref 2.6–24.9)

## 2020-07-16 ENCOUNTER — Other Ambulatory Visit (INDEPENDENT_AMBULATORY_CARE_PROVIDER_SITE_OTHER): Payer: Self-pay | Admitting: Adult Health

## 2020-07-16 DIAGNOSIS — E559 Vitamin D deficiency, unspecified: Secondary | ICD-10-CM

## 2020-07-18 ENCOUNTER — Encounter (INDEPENDENT_AMBULATORY_CARE_PROVIDER_SITE_OTHER): Payer: Self-pay | Admitting: Adult Health

## 2020-07-18 ENCOUNTER — Other Ambulatory Visit: Payer: Self-pay

## 2020-07-18 ENCOUNTER — Ambulatory Visit (INDEPENDENT_AMBULATORY_CARE_PROVIDER_SITE_OTHER): Payer: Managed Care, Other (non HMO) | Admitting: Adult Health

## 2020-07-18 VITALS — BP 118/70 | HR 69 | Temp 97.9°F | Ht 68.0 in | Wt 206.0 lb

## 2020-07-18 DIAGNOSIS — E559 Vitamin D deficiency, unspecified: Secondary | ICD-10-CM | POA: Diagnosis not present

## 2020-07-18 DIAGNOSIS — R7401 Elevation of levels of liver transaminase levels: Secondary | ICD-10-CM

## 2020-07-18 DIAGNOSIS — R7303 Prediabetes: Secondary | ICD-10-CM | POA: Diagnosis not present

## 2020-07-18 DIAGNOSIS — E66811 Body mass index (BMI) 31.0-31.9, adult: Secondary | ICD-10-CM

## 2020-07-18 DIAGNOSIS — E669 Obesity, unspecified: Secondary | ICD-10-CM

## 2020-07-18 DIAGNOSIS — Z6831 Body mass index (BMI) 31.0-31.9, adult: Secondary | ICD-10-CM

## 2020-07-18 DIAGNOSIS — E782 Mixed hyperlipidemia: Secondary | ICD-10-CM

## 2020-07-18 DIAGNOSIS — Z9189 Other specified personal risk factors, not elsewhere classified: Secondary | ICD-10-CM | POA: Diagnosis not present

## 2020-07-18 NOTE — Progress Notes (Signed)
Chief Complaint:   OBESITY Adam Adams is here to discuss his progress with his obesity treatment plan along with follow-up of his obesity related diagnoses. Adam Adams is keeping a food journal and adhering to recommended goals of 1800-2000 calories and 120 grams of protein and states he is following his eating plan approximately 70% of the time. Adam Adams states he is doing cardio/weights 30-45 minutes 3 times per week.  Today's visit was #: 8 Starting weight: 203 lbs Starting date: 03/28/2020 Today's weight: 206 lbs Today's date: 07/18/2020 Total lbs lost to date: 0 Total lbs lost since last in-office visit: 2  Interim History: Adam Adams has combined Keto with his journaling in terms of eliminating rice from his diet. He has increased regular exercise - cardio/weights 30-45 minutes - [employer] 3 times a week. He has been hitting his calorie and protein goals 70% of the time and tracking intake 80% of the time.  Subjective:   Transaminitis. CMP on 07/04/2020 showed ALT 57, increased from 49 on 06/15/2020. Shandon denies right upper quadrant pain currently. CT on 03/13/2020 showed fatty liver.  Vitamin D deficiency. Vitamin D level on 07/04/2020 was 42.0, below goal of 50. Chasen is on Ergocalciferol. No nausea, vomiting, or muscle weakness.    Ref. Range 07/04/2020 09:43  Vitamin D, 25-Hydroxy Latest Ref Range: 30.0 - 100.0 ng/mL 42.0   Mixed hyperlipidemia. Adam Adams has hyperlipidemia and has been trying to improve his cholesterol levels with intensive lifestyle modification including a low saturated fat diet, exercise and weight loss. He denies any chest pain, claudication or myalgias. Lipid panel on 07/04/2020 showed total cholesterol and LDL levels improving; LDL still above goal at 117.  Lab Results  Component Value Date   ALT 57 (H) 07/04/2020   AST 34 07/04/2020   ALKPHOS 56 07/04/2020   BILITOT 0.5 07/04/2020   Lab Results  Component Value Date   CHOL 185 07/04/2020   HDL 51  07/04/2020   LDLCALC 117 (H) 07/04/2020   TRIG 95 07/04/2020   CHOLHDL 3.6 07/04/2020   Prediabetes. Adam Adams has a diagnosis of prediabetes based on his elevated HgA1c and was informed this puts him at greater risk of developing diabetes. He continues to work on diet and exercise to decrease his risk of diabetes. He denies nausea or hypoglycemia. 07/04/2020 blood glucose 96, A1c 5.3 with an insulin level of 19.1. Adam Adams is on metformin 500 mg 1/2 tab with breakfast and 1/2 tab with lunch. A1c and insulin levels are both improving!  Lab Results  Component Value Date   HGBA1C 5.3 07/04/2020   Lab Results  Component Value Date   INSULIN 19.1 07/04/2020   INSULIN 28.7 (H) 03/28/2020   At risk for heart disease. Adam Adams is at a higher than average risk for cardiovascular disease due to hyperlipidemia and obesity.   Assessment/Plan:   Transaminitis. [name] will continue to reduce saturated fat and increase regular exercise.  Vitamin D deficiency. Low Vitamin D level contributes to fatigue and are associated with obesity, breast, and colon cancer. He was given a refill on his prescription Vitamin D @50 ,000 IU every week #4 with 0 refills and will follow-up for routine testing of Vitamin D, at least 2-3 times per year to avoid over-replacement.  Mixed hyperlipidemia. Cardiovascular risk and specific lipid/LDL goals reviewed.  We discussed several lifestyle modifications today and Adam Adams will continue to work on diet, exercise and weight loss efforts. Orders and follow up as documented in patient record. He will continue  to decrease saturated fat and increase exercise.  Counseling Intensive lifestyle modifications are the first line treatment for this issue.  Dietary changes: Increase soluble fiber. Decrease simple carbohydrates.  Exercise changes: Moderate to vigorous-intensity aerobic activity 150 minutes per week if tolerated.  Lipid-lowering medications: see documented in medical  record.  Prediabetes. Adam Adams will continue to work on weight loss, exercise, and decreasing simple carbohydrates to help decrease the risk of diabetes. He will continue metformin as directed and continue regular exercise.  At risk for heart disease. Adam Adams was given approximately 15 minutes of coronary artery disease prevention counseling today. He is 29 y.o. male and has risk factors for heart disease including obesity. We discussed intensive lifestyle modifications today with an emphasis on specific weight loss instructions and strategies.   Repetitive spaced learning was employed today to elicit superior memory formation and behavioral change.  Class 1 obesity with serious comorbidity and body mass index (BMI) of 31.0 to 31.9 in adult, unspecified obesity type.  Adam Adams is currently in the action stage of change. As such, his goal is to continue with weight loss efforts. He has agreed to keeping a food journal and adhering to recommended goals of 1800-2000 calories and 120 grams of protein daily.   Handouts were provided on Thanksgiving and Recipes.  Exercise goals: [name] will continue cardio/weights 30-45 minutes 3 times per week.  Behavioral modification strategies: increasing lean protein intake, meal planning and cooking strategies and planning for success.  Adam Adams has agreed to follow-up with our clinic in 2 weeks. He was informed of the importance of frequent follow-up visits to maximize his success with intensive lifestyle modifications for his multiple health conditions.   Objective:   Blood pressure 118/70, pulse 69, temperature 97.9 F (36.6 C), height 5\' 8"  (1.727 m), weight 206 lb (93.4 kg), SpO2 99 %. Body mass index is 31.32 kg/m.  General: Cooperative, alert, well developed, in no acute distress. HEENT: Conjunctivae and lids unremarkable. Cardiovascular: Regular rhythm.  Lungs: Normal work of breathing. Neurologic: No focal deficits.   Lab Results  Component Value Date    CREATININE 0.86 07/04/2020   BUN 14 07/04/2020   NA 139 07/04/2020   K 4.5 07/04/2020   CL 104 07/04/2020   CO2 25 07/04/2020   Lab Results  Component Value Date   ALT 57 (H) 07/04/2020   AST 34 07/04/2020   ALKPHOS 56 07/04/2020   BILITOT 0.5 07/04/2020   Lab Results  Component Value Date   HGBA1C 5.3 07/04/2020   HGBA1C 5.7 (H) 03/28/2020   HGBA1C 5.5 12/09/2017   HGBA1C 5.8 (H) 05/31/2014   Lab Results  Component Value Date   INSULIN 19.1 07/04/2020   INSULIN 28.7 (H) 03/28/2020   Lab Results  Component Value Date   TSH 0.78 06/15/2020   Lab Results  Component Value Date   CHOL 185 07/04/2020   HDL 51 07/04/2020   LDLCALC 117 (H) 07/04/2020   TRIG 95 07/04/2020   CHOLHDL 3.6 07/04/2020   Lab Results  Component Value Date   WBC 6.2 06/15/2020   HGB 15.6 06/15/2020   HCT 45.4 06/15/2020   MCV 93.0 06/15/2020   PLT 351 06/15/2020   No results found for: IRON, TIBC, FERRITIN  Attestation Statements:   Reviewed by clinician on day of visit: allergies, medications, problem list, medical history, surgical history, family history, social history, and previous encounter notes.  I10/16/2021, am acting as Marianna Payment for Energy manager, NP-C   I have reviewed  the above documentation for accuracy and completeness, and I agree with the above. -  Airen Stiehl d. Aneesha Holloran, NP-C

## 2020-07-21 ENCOUNTER — Other Ambulatory Visit: Payer: Self-pay

## 2020-07-21 ENCOUNTER — Ambulatory Visit (INDEPENDENT_AMBULATORY_CARE_PROVIDER_SITE_OTHER): Payer: Managed Care, Other (non HMO) | Admitting: Sports Medicine

## 2020-07-21 DIAGNOSIS — M797 Fibromyalgia: Secondary | ICD-10-CM

## 2020-07-21 DIAGNOSIS — J029 Acute pharyngitis, unspecified: Secondary | ICD-10-CM | POA: Diagnosis not present

## 2020-07-21 DIAGNOSIS — M2550 Pain in unspecified joint: Secondary | ICD-10-CM | POA: Diagnosis not present

## 2020-07-21 DIAGNOSIS — F4310 Post-traumatic stress disorder, unspecified: Secondary | ICD-10-CM | POA: Diagnosis not present

## 2020-07-21 MED ORDER — DULOXETINE HCL 30 MG PO CPEP: 30.0000 mg | ORAL_CAPSULE | Freq: Every day | ORAL | 3 refills | Status: DC

## 2020-07-21 NOTE — Assessment & Plan Note (Signed)
Has historically been controlled with trazodone, gabapentin has been effective as well to help the insomnia component. Adding Cymbalta as above, he also requests a note for therapy dog.

## 2020-07-21 NOTE — Assessment & Plan Note (Signed)
This is a very pleasant 29 year old male, all of his life he had widespread aches and pains, and he did have a diagnosis of fibromyalgia from the physicians in [place] where he grew up, we did uncover several degenerative processes, not responsive to typical treatments. Ultimately we did full rheumatoid testing that was overall unremarkable, I added gabapentin which was very effective, he is currently doing 300 mg approximately 3 times daily with mild grogginess. We will go ahead and drop down his gabapentin to 300 mg to 600 mg nightly, and add Cymbalta that this will also help his anxiety symptoms. Starting at 30 mg, return in 6 weeks for this.

## 2020-07-21 NOTE — Assessment & Plan Note (Signed)
Unremarkable exam with only mild pharyngitis, tonsillar hypertrophy, Centor score equals 1. He will use over-the-counter DayQuil/NyQuil or TheraFlu. Return as needed.

## 2020-07-21 NOTE — Patient Instructions (Signed)

## 2020-07-21 NOTE — Progress Notes (Signed)
    Procedures performed today:    None.  Independent interpretation of notes and tests performed by another provider:   None.  Brief History, Exam, Impression, and Recommendations:    Fibromyalgia This is a very pleasant 30 year old male, all of his life he had widespread aches and pains, and he did have a diagnosis of fibromyalgia from the physicians in [place] where he grew up, we did uncover several degenerative processes, not responsive to typical treatments. Ultimately we did full rheumatoid testing that was overall unremarkable, I added gabapentin which was very effective, he is currently doing 300 mg approximately 3 times daily with mild grogginess. We will go ahead and drop down his gabapentin to 300 mg to 600 mg nightly, and add Cymbalta that this will also help his anxiety symptoms. Starting at 30 mg, return in 6 weeks for this.  Anxiety and PTSD Has historically been controlled with trazodone, gabapentin has been effective as well to help the insomnia component. Adding Cymbalta as above, he also requests a note for therapy dog.  Viral pharyngitis Unremarkable exam with only mild pharyngitis, tonsillar hypertrophy, Centor score equals 1. He will use over-the-counter DayQuil/NyQuil or TheraFlu. Return as needed.    ___________________________________________ [name]. Benjamin Stain, M.D., ABFM., CAQSM. Primary Care and Sports Medicine [facility]  Adjunct Instructor of Family Medicine  [employer]

## 2020-07-28 ENCOUNTER — Other Ambulatory Visit (INDEPENDENT_AMBULATORY_CARE_PROVIDER_SITE_OTHER): Payer: Self-pay | Admitting: Adult Health

## 2020-07-28 DIAGNOSIS — R7303 Prediabetes: Secondary | ICD-10-CM

## 2020-08-01 ENCOUNTER — Other Ambulatory Visit: Payer: Self-pay

## 2020-08-01 ENCOUNTER — Encounter (INDEPENDENT_AMBULATORY_CARE_PROVIDER_SITE_OTHER): Payer: Self-pay | Admitting: Family Medicine

## 2020-08-01 ENCOUNTER — Ambulatory Visit (INDEPENDENT_AMBULATORY_CARE_PROVIDER_SITE_OTHER): Payer: Managed Care, Other (non HMO) | Admitting: Family Medicine

## 2020-08-01 VITALS — BP 114/69 | HR 64 | Temp 98.1°F | Ht 68.0 in | Wt 209.0 lb

## 2020-08-01 DIAGNOSIS — Z6831 Body mass index (BMI) 31.0-31.9, adult: Secondary | ICD-10-CM | POA: Diagnosis not present

## 2020-08-01 DIAGNOSIS — F411 Generalized anxiety disorder: Secondary | ICD-10-CM | POA: Insufficient documentation

## 2020-08-01 DIAGNOSIS — E88819 Insulin resistance, unspecified: Secondary | ICD-10-CM | POA: Insufficient documentation

## 2020-08-01 DIAGNOSIS — E8881 Metabolic syndrome: Secondary | ICD-10-CM

## 2020-08-01 DIAGNOSIS — E669 Obesity, unspecified: Secondary | ICD-10-CM

## 2020-08-01 DIAGNOSIS — F32A Depression, unspecified: Secondary | ICD-10-CM | POA: Insufficient documentation

## 2020-08-01 DIAGNOSIS — F3289 Other specified depressive episodes: Secondary | ICD-10-CM | POA: Diagnosis not present

## 2020-08-01 NOTE — Progress Notes (Signed)
Chief Complaint:   OBESITY Adam Adams is here to discuss his progress with his obesity treatment plan along with follow-up of his obesity related diagnoses. Adam Adams is on keeping a food journal and adhering to recommended goals of 1800-2000 calories and 120 grams of protein and states he is following his eating plan approximately 75% of the time. Adam Adams states he is doing cardio and lifting weights for 30-45 minutes 3 times per week.  Today's visit was #: 9 Starting weight: 203 lbs Starting date: 03/28/2020 Today's weight: 209 lbs Today's date: 08/01/2020 Total lbs lost to date: 0 Total lbs lost since last in-office visit: 0  Interim History: Adam Adams notes that his high weight was 238 pounds (before starting our program).   He is currently journaling consistently but he does feel that Thanksgiving got him off plan. His wife actually helps prepare food and journals for him. He does not use [employer]itness [employer] himself. He has had success with intermittent fasting in the past and has been eating in this manner recently.  He says that when he eats breakfast he is hungrier throughout day.  He would like to eat between 2 pm and 8 pm.  His goal is 190 pounds (BMI 28).  He says that his hunger has improved since intermittent fasting.  He is meeting his calorie and protein goals. He is trying to limit carbohydrates and notes he feels better with less carbs.  He will be traveling to [place] on vacation on 08/19/2020.  Subjective:   1. Insulin resistance A1c has reduced form 5.7 to 5.3.  Fasting insulin has decreased also from 28.7 to 19.1.  He is on metformin 1/2 tablet twice daily. He denies polyphagia.  Lab Results  Component Value Date   INSULIN 19.1 07/04/2020   INSULIN 28.7 (H) 03/28/2020   Lab Results  Component Value Date   HGBA1C 5.3 07/04/2020   2. Other depression, with emotional eating He is on Cymbalta and gapapentin for chronic pain. Notes eating more when he is anxious.  He has fibromyalgia  and PTSD.  Assessment/Plan:   1. Insulin resistance Continue metformin.  2. Other depression, with emotional eating Continue medications.  He is getting a service dog (Adam Adams) for anxiety. He may benefit from seeing Dr. Dewaine Conger in the future.  3. Class 1 obesity with serious comorbidity and body mass index (BMI) of 31.0 to 31.9 in adult, unspecified obesity type  Adam Adams is currently in the action stage of change. As such, his goal is to continue with weight loss efforts. He has agreed to keeping a food journal and adhering to recommended goals of 1800-2000 calories and 120 grams of protein daily.   Adam Adams will eat between 2 pm and 8 pm but still meet calories and protein goals.   Exercise goals: As is.  Behavioral modification strategies: travel eating strategies and keeping a strict food journal. He will try to journal while on vacation. I encouraged him to journal "the good, the bad, and the ugly".  Adam Adams has agreed to follow-up with our clinic in 2 weeks.   Objective:   Blood pressure 114/69, pulse 64, temperature 98.1 F (36.7 C), height 5\' 8"  (1.727 m), weight 209 lb (94.8 kg), SpO2 99 %. Body mass index is 31.78 kg/m.  General: Cooperative, alert, well developed, in no acute distress. HEENT: Conjunctivae and lids unremarkable. Cardiovascular: Regular rhythm.  Lungs: Normal work of breathing. Neurologic: No focal deficits.   Lab Results  Component Value Date  CREATININE 0.86 07/04/2020   BUN 14 07/04/2020   NA 139 07/04/2020   K 4.5 07/04/2020   CL 104 07/04/2020   CO2 25 07/04/2020   Lab Results  Component Value Date   ALT 57 (H) 07/04/2020   AST 34 07/04/2020   ALKPHOS 56 07/04/2020   BILITOT 0.5 07/04/2020   Lab Results  Component Value Date   HGBA1C 5.3 07/04/2020   HGBA1C 5.7 (H) 03/28/2020   HGBA1C 5.5 12/09/2017   HGBA1C 5.8 (H) 05/31/2014   Lab Results  Component Value Date   INSULIN 19.1 07/04/2020   INSULIN 28.7 (H) 03/28/2020    Lab Results  Component Value Date   TSH 0.78 06/15/2020   Lab Results  Component Value Date   CHOL 185 07/04/2020   HDL 51 07/04/2020   LDLCALC 117 (H) 07/04/2020   TRIG 95 07/04/2020   CHOLHDL 3.6 07/04/2020   Lab Results  Component Value Date   WBC 6.2 06/15/2020   HGB 15.6 06/15/2020   HCT 45.4 06/15/2020   MCV 93.0 06/15/2020   PLT 351 06/15/2020   Attestation Statements:   Reviewed by clinician on day of visit: allergies, medications, problem list, medical history, surgical history, family history, social history, and previous encounter notes.  I, Insurance claims handler, CMA, am acting as Energy manager for [place], FNP.  I have reviewed the above documentation for accuracy and completeness, and I agree with the above. -  Jesse Sans, FNP

## 2020-08-15 ENCOUNTER — Ambulatory Visit (INDEPENDENT_AMBULATORY_CARE_PROVIDER_SITE_OTHER): Payer: Managed Care, Other (non HMO) | Admitting: Adult Health

## 2020-08-15 ENCOUNTER — Encounter (INDEPENDENT_AMBULATORY_CARE_PROVIDER_SITE_OTHER): Payer: Self-pay | Admitting: Adult Health

## 2020-08-15 ENCOUNTER — Other Ambulatory Visit: Payer: Self-pay

## 2020-08-15 VITALS — BP 128/68 | HR 62 | Temp 97.8°F | Ht 68.0 in | Wt 206.0 lb

## 2020-08-15 DIAGNOSIS — E669 Obesity, unspecified: Secondary | ICD-10-CM

## 2020-08-15 DIAGNOSIS — R7303 Prediabetes: Secondary | ICD-10-CM | POA: Diagnosis not present

## 2020-08-15 DIAGNOSIS — Z9189 Other specified personal risk factors, not elsewhere classified: Secondary | ICD-10-CM | POA: Diagnosis not present

## 2020-08-15 DIAGNOSIS — E559 Vitamin D deficiency, unspecified: Secondary | ICD-10-CM | POA: Diagnosis not present

## 2020-08-15 DIAGNOSIS — Z6831 Body mass index (BMI) 31.0-31.9, adult: Secondary | ICD-10-CM

## 2020-08-15 DIAGNOSIS — F3289 Other specified depressive episodes: Secondary | ICD-10-CM

## 2020-08-15 MED ORDER — VITAMIN D (ERGOCALCIFEROL) 1.25 MG (50000 UNIT) PO CAPS: 50000.0000 [IU] | ORAL_CAPSULE | ORAL | 0 refills | Status: DC

## 2020-08-15 NOTE — Progress Notes (Signed)
Chief Complaint:   OBESITY Adam Adams is here to discuss his progress with his obesity treatment plan along with follow-up of his obesity related diagnoses. Adam Adams is keeping a food journal and adhering to recommended goals of 1800-2000 calories and 120 grams of protein and states he is following his eating plan approximately 75% of the time. Adam Adams states he is doing weights/cardio 30-45 minutes 3 times per week.  Today's visit was #: 10 Starting weight: 203 lbs Starting date: 03/28/2020 Today's weight: 206 lbs Today's date: 08/15/2020 Total lbs lost to date: 0 Total lbs lost since last in-office visit: 3  Interim History: Adam Adams will be traveling to [place] for 10 days, leaving 08/19/2020. He will typically have a large meal at 1/2 p.m., snack throughout the day, and have a smaller dinner. He tries to consume all food within a 6-8 hour window.  He denies episodes of hypoglycemia with this eating pattern.  Subjective:   Vitamin D deficiency. Vitamin D level on 07/04/2020 was 42.0, below goal of 50. Adam Adams is on Ergocalciferol. No nausea, vomiting, or muscle weakness.    Ref. Range 07/04/2020 09:43  Vitamin D, 25-Hydroxy Latest Ref Range: 30.0 - 100.0 ng/mL 42.0   Prediabetes. Adam Adams has a diagnosis of prediabetes based on his elevated HgA1c and was informed this puts him at greater risk of developing diabetes. He continues to work on diet and exercise to decrease his risk of diabetes. He denies nausea or hypoglycemia. 07/04/2020 A1c much improved at 5.3, down from 5.7 on 03/28/2020. Insulin level was also improved at 19.1.  Lab Results  Component Value Date   HGBA1C 5.3 07/04/2020   Lab Results  Component Value Date   INSULIN 19.1 07/04/2020   INSULIN 28.7 (H) 03/28/2020   Other depression, with emotional eating. Adam Adams is struggling with emotional eating and using food for comfort to the extent that it is negatively impacting his health. He has been working on behavior  modification techniques to help reduce his emotional eating and has been somewhat successful. He shows no sign of suicidal or homicidal ideations. Adam Adams has history of fibromyalgia and PTSD and is managed with duloxetine 30 mg daily and gabapentin 300 mg 1-2 caps QHS. He reports a decrease in overall pain levels.  At risk for diabetes mellitus. Adam Adams is at higher than average risk for developing diabetes due to insulin resistance and obesity.   Assessment/Plan:   Vitamin D deficiency. Low Vitamin D level contributes to fatigue and are associated with obesity, breast, and colon cancer. He was given a refill on his Vitamin D, Ergocalciferol, (DRISDOL) 1.25 MG (50000 UNIT) CAPS capsule every week #4 with 0 refills and will follow-up for routine testing of Vitamin D, at least 2-3 times per year to avoid over-replacement.   Prediabetes. Adam Adams will continue to work on weight loss, exercise, and decreasing simple carbohydrates to help decrease the risk of diabetes. He will continue metformin 500 mg 1/2 tab with breakfast, (no medication refill today).  Other depression, with emotional eating. Behavior modification techniques were discussed today to help Adam Adams deal with his emotional/non-hunger eating behaviors.  Orders and follow up as documented in patient record. Adam Adams will continue his current medication regimen as directed.   At risk for diabetes mellitus. Adam Adams was given approximately 15 minutes of diabetes education and counseling today. We discussed intensive lifestyle modifications today with an emphasis on weight loss as well as increasing exercise and decreasing simple carbohydrates in his diet. We also reviewed  medication options with an emphasis on risk versus benefit of those discussed.   Repetitive spaced learning was employed today to elicit superior memory formation and behavioral change.  Class 1 obesity with serious comorbidity and body mass index (BMI) of 31.0 to 31.9 in adult,  unspecified obesity type.  Adam Adams is currently in the action stage of change. As such, his goal is to continue with weight loss efforts. He has agreed to keeping a food journal and adhering to recommended goals of 1800-2000 calories and 120 grams of protein daily with intermittent fasting.   Handouts were provided on Holiday Strategies and Holiday Recipes.  Exercise goals: Adam Adams will continue weights/cardio 30-45 minutes 3 times per week.  Behavioral modification strategies: increasing lean protein intake, increasing water intake, meal planning and cooking strategies, planning for success and keeping a strict food journal.  Adam Adams has agreed to follow-up with our clinic in 3 weeks. He was informed of the importance of frequent follow-up visits to maximize his success with intensive lifestyle modifications for his multiple health conditions.   Objective:   Blood pressure 128/68, pulse 62, temperature 97.8 F (36.6 C), height 5\' 8"  (1.727 m), weight 206 lb (93.4 kg), SpO2 100 %. Body mass index is 31.32 kg/m.  General: Cooperative, alert, well developed, in no acute distress. HEENT: Conjunctivae and lids unremarkable. Cardiovascular: Regular rhythm.  Lungs: Normal work of breathing. Neurologic: No focal deficits.   Lab Results  Component Value Date   CREATININE 0.86 07/04/2020   BUN 14 07/04/2020   NA 139 07/04/2020   K 4.5 07/04/2020   CL 104 07/04/2020   CO2 25 07/04/2020   Lab Results  Component Value Date   ALT 57 (H) 07/04/2020   AST 34 07/04/2020   ALKPHOS 56 07/04/2020   BILITOT 0.5 07/04/2020   Lab Results  Component Value Date   HGBA1C 5.3 07/04/2020   HGBA1C 5.7 (H) 03/28/2020   HGBA1C 5.5 12/09/2017   HGBA1C 5.8 (H) 05/31/2014   Lab Results  Component Value Date   INSULIN 19.1 07/04/2020   INSULIN 28.7 (H) 03/28/2020   Lab Results  Component Value Date   TSH 0.78 06/15/2020   Lab Results  Component Value Date   CHOL 185 07/04/2020   HDL 51 07/04/2020    LDLCALC 117 (H) 07/04/2020   TRIG 95 07/04/2020   CHOLHDL 3.6 07/04/2020   Lab Results  Component Value Date   WBC 6.2 06/15/2020   HGB 15.6 06/15/2020   HCT 45.4 06/15/2020   MCV 93.0 06/15/2020   PLT 351 06/15/2020   No results found for: IRON, TIBC, FERRITIN  Attestation Statements:   Reviewed by clinician on day of visit: allergies, medications, problem list, medical history, surgical history, family history, social history, and previous encounter notes.  I, 06/17/2020, am acting as [place] [employer] for Energy manager, NP-C   I have reviewed the above documentation for accuracy and completeness, and I agree with the above. -  Shizuo Biskup d. Kalil Woessner, NP-C

## 2020-08-16 ENCOUNTER — Other Ambulatory Visit: Payer: Self-pay | Admitting: Sports Medicine

## 2020-08-16 DIAGNOSIS — M797 Fibromyalgia: Secondary | ICD-10-CM

## 2020-09-04 ENCOUNTER — Ambulatory Visit: Payer: Managed Care, Other (non HMO) | Admitting: Sports Medicine

## 2020-09-11 ENCOUNTER — Other Ambulatory Visit: Payer: Self-pay

## 2020-09-11 ENCOUNTER — Encounter (INDEPENDENT_AMBULATORY_CARE_PROVIDER_SITE_OTHER): Payer: Self-pay | Admitting: Adult Health

## 2020-09-11 ENCOUNTER — Telehealth (INDEPENDENT_AMBULATORY_CARE_PROVIDER_SITE_OTHER): Payer: Managed Care, Other (non HMO) | Admitting: Adult Health

## 2020-09-11 DIAGNOSIS — M797 Fibromyalgia: Secondary | ICD-10-CM

## 2020-09-11 DIAGNOSIS — R7303 Prediabetes: Secondary | ICD-10-CM | POA: Diagnosis not present

## 2020-09-11 DIAGNOSIS — E669 Obesity, unspecified: Secondary | ICD-10-CM

## 2020-09-12 NOTE — Progress Notes (Signed)
TeleHealth Visit:  Due to the COVID-19 pandemic, this visit was completed with telemedicine (audio/video) technology to reduce patient and provider exposure as well as to preserve personal protective equipment.   Adam Adams has verbally consented to this TeleHealth visit. The patient is located at home, the provider is located at the [facility] office. The participants in this visit include the listed provider and patient. The visit was conducted today via telephone.  Cassie was unable to use realtime audiovisual technology today and the telehealth visit was conducted via telephone.  Chief Complaint: OBESITY Adam Adams is here to discuss his progress with his obesity treatment plan along with follow-up of his obesity related diagnoses. Adam Adams is on keeping a food journal and adhering to recommended goals of 1800-2000 calories and 120 g protein and states he is following his eating plan approximately 75% of the time. Adam Adams states he is exercising 0 minutes 0 times per week.  Today's visit was #: 11 Starting weight: 203 lbs Starting date: 03/28/2020  Interim History: Adam Adams was recently at [facility] over the weekend for his annual physical with the [employer]. He returned from [place] 30 August 2020. He is currently experiencing muscle pain (hx of fibromyalgia), headache, and sore throat. He denies cough and fever. He is unaware of known COVID-19 exposure. He has received 2 doses of Pfizer COVID-19 vaccine, with his last dose in September 2021.  Subjective:   1. Pre-diabetes Adam Adams has a diagnosis of prediabetes based on his elevated HgA1c and was informed this puts him at greater risk of developing diabetes. He continues to work on diet and exercise to decrease his risk of diabetes. He denies nausea or hypoglycemia. Adam Adams's A1c has been trending down. Last check on 07/04/2020 was 5.3- excellent. He is on Metformin 500 mg 1/2 tab with breakfast and 1/2 with lunch.   Lab Results   Component Value Date   HGBA1C 5.3 07/04/2020   Lab Results  Component Value Date   INSULIN 19.1 07/04/2020   INSULIN 28.7 (H) 03/28/2020    2. Fibromyalgia Adam Adams has an increase in muscle pain since returning from [place]. He is also experiencing headache and sore throat. He is concerned about COVID-19 infection. He is on Cymbalta and Gabapentin for symptom control.  Assessment/Plan:   1. Pre-diabetes Adam Adams will continue to work on weight loss, exercise, and decreasing simple carbohydrates to help decrease the risk of diabetes. Continue Metformin and journaling plan.  2. Fibromyalgia Continue current medication regime.  Due to muscle pain plus other acute sx's- proceed for SARS-Cov2 PCR testing.  3. Class 1 obesity with serious comorbidity and body mass index (BMI) of 31.0 to 31.9 in adult, unspecified obesity type Adam Adams is currently in the action stage of change. As such, his goal is to continue with weight loss efforts. He has agreed to keeping a food journal and adhering to recommended goals of 1800-2000 calories and 120 g protein.   Exercise goals: As is...walking  Behavioral modification strategies: increasing lean protein intake, no skipping meals, meal planning and cooking strategies, planning for success and keeping a strict food journal.  Adam Adams has agreed to follow-up with our clinic on 09/25/2020. He was informed of the importance of frequent follow-up visits to maximize his success with intensive lifestyle modifications for his multiple health conditions.  Objective:   VITALS: Per patient if applicable, see vitals. GENERAL: Alert and in no acute distress. CARDIOPULMONARY: No increased WOB. Speaking in clear sentences.  PSYCH: Pleasant and  cooperative. Speech normal rate and rhythm. Affect is appropriate. Insight and judgement are appropriate. Attention is focused, linear, and appropriate.  NEURO: Oriented as arrived to appointment on time with no prompting.   Lab  Results  Component Value Date   CREATININE 0.86 07/04/2020   BUN 14 07/04/2020   NA 139 07/04/2020   K 4.5 07/04/2020   CL 104 07/04/2020   CO2 25 07/04/2020   Lab Results  Component Value Date   ALT 57 (H) 07/04/2020   AST 34 07/04/2020   ALKPHOS 56 07/04/2020   BILITOT 0.5 07/04/2020   Lab Results  Component Value Date   HGBA1C 5.3 07/04/2020   HGBA1C 5.7 (H) 03/28/2020   HGBA1C 5.5 12/09/2017   HGBA1C 5.8 (H) 05/31/2014   Lab Results  Component Value Date   INSULIN 19.1 07/04/2020   INSULIN 28.7 (H) 03/28/2020   Lab Results  Component Value Date   TSH 0.78 06/15/2020   Lab Results  Component Value Date   CHOL 185 07/04/2020   HDL 51 07/04/2020   LDLCALC 117 (H) 07/04/2020   TRIG 95 07/04/2020   CHOLHDL 3.6 07/04/2020   Lab Results  Component Value Date   WBC 6.2 06/15/2020   HGB 15.6 06/15/2020   HCT 45.4 06/15/2020   MCV 93.0 06/15/2020   PLT 351 06/15/2020     Attestation Statements:   Reviewed by clinician on day of visit: allergies, medications, problem list, medical history, surgical history, family history, social history, and previous encounter notes.  Time spent on visit including pre-visit chart review and post-visit charting and care was 23 minutes.   Adam Adams, am acting as Energy manager for Adam Hamburger, NP.  I have reviewed the above documentation for accuracy and completeness, and I agree with the above. - Tyrell Brereton d. Adam Gilles, NP-C

## 2020-09-25 ENCOUNTER — Encounter (INDEPENDENT_AMBULATORY_CARE_PROVIDER_SITE_OTHER): Payer: Self-pay | Admitting: Adult Health

## 2020-09-25 ENCOUNTER — Other Ambulatory Visit: Payer: Self-pay

## 2020-09-25 ENCOUNTER — Ambulatory Visit (INDEPENDENT_AMBULATORY_CARE_PROVIDER_SITE_OTHER): Payer: Managed Care, Other (non HMO) | Admitting: Adult Health

## 2020-09-25 VITALS — BP 112/73 | HR 64 | Temp 97.5°F | Ht 68.0 in | Wt 212.0 lb

## 2020-09-25 DIAGNOSIS — R7303 Prediabetes: Secondary | ICD-10-CM

## 2020-09-25 DIAGNOSIS — Z6832 Body mass index (BMI) 32.0-32.9, adult: Secondary | ICD-10-CM | POA: Diagnosis not present

## 2020-09-25 DIAGNOSIS — E559 Vitamin D deficiency, unspecified: Secondary | ICD-10-CM

## 2020-09-25 DIAGNOSIS — E669 Obesity, unspecified: Secondary | ICD-10-CM | POA: Diagnosis not present

## 2020-09-25 DIAGNOSIS — E66811 Obesity, class 1: Secondary | ICD-10-CM

## 2020-09-25 NOTE — Progress Notes (Addendum)
Chief Complaint:   OBESITY Adam Adams is here to discuss his progress with his obesity treatment plan along with follow-up of his obesity related diagnoses. Adam Adams is on keeping a food journal and adhering to recommended goals of 1800-2000 calories and 120 g protein and states he is following his eating plan approximately 60% of the time. Adam Adams states he is doing cardio, walking the dog, and calisthenics 30 minutes 7 times per week.  Today's visit was #: 12 Starting weight: 203 lbs Starting date: 03/28/2020 Today's weight: 212 lbs Today's date: 09/25/2020 Total lbs lost to date: 0 Total lbs lost since last in-office visit: 0  Interim History: Since he returned from [place] on 08/30/2020, pt has been challenged to get back on track with journaling. He is also trying to follow 18/6 Intermittent Fasting and reports being unable to consume all cal/proteins in the 6 hour eating window.  Subjective:   1. Pre-diabetes 07/04/2020 A1c and insulin levels have both improved; A1c 5.3. Pt is on Metformin 500 mg 1/2 tablet with breakfast and 1/2 tab with dinner.  Lab Results  Component Value Date   HGBA1C 5.3 07/04/2020   Lab Results  Component Value Date   INSULIN 19.1 07/04/2020   INSULIN 28.7 (H) 03/28/2020    2. Vitamin D deficiency Adam Adams's Vitamin D level was 42.0 on 07/04/2020, which is below goal of 50. He is currently taking prescription vitamin D 50,000 IU each week. He denies nausea, vomiting or muscle weakness.   Ref. Range 07/04/2020 09:43  Vitamin D, 25-Hydroxy Latest Ref Range: 30.0 - 100.0 ng/mL 42.0    Assessment/Plan:   1. Pre-diabetes Adam Adams will continue to work on weight loss, exercise, and decreasing simple carbohydrates to help decrease the risk of diabetes. Continue Metformin and regular exercise.  2. Vitamin D deficiency Low Vitamin D level contributes to fatigue and are associated with obesity, breast, and colon cancer. He agrees to continue to take prescription  Vitamin D @50 ,000 IU every week and will follow-up for routine testing of Vitamin D, at least 2-3 times per year to avoid over-replacement. Pt denies need for refill  3. Class 1 obesity with serious comorbidity and body mass index (BMI) of 32.0 to 32.9 in adult, unspecified obesity type Adam Adams is currently in the action stage of change. As such, his goal is to continue with weight loss efforts. He has agreed to 1800-2000 cal with 120g protein per day.   Recommend changing fasting schedule to 16/8 and incorporate protein snacks to help boost daily protein intake.  Exercise goals: As is  Behavioral modification strategies: increasing lean protein intake, meal planning and cooking strategies and planning for success.  Adam Adams has agreed to follow-up with our clinic in 2 weeks. He was informed of the importance of frequent follow-up visits to maximize his success with intensive lifestyle modifications for his multiple health conditions.   Objective:   Blood pressure 112/73, pulse 64, temperature (!) 97.5 F (36.4 C), height 5\' 8"  (1.727 m), weight 212 lb (96.2 kg), SpO2 98 %. Body mass index is 32.23 kg/m.  General: Cooperative, alert, well developed, in no acute distress. HEENT: Conjunctivae and lids unremarkable. Cardiovascular: Regular rhythm.  Lungs: Normal work of breathing. Neurologic: No focal deficits.   Lab Results  Component Value Date   CREATININE 0.86 07/04/2020   BUN 14 07/04/2020   NA 139 07/04/2020   K 4.5 07/04/2020   CL 104 07/04/2020   CO2 25 07/04/2020   Lab Results  Component  Value Date   ALT 57 (H) 07/04/2020   AST 34 07/04/2020   ALKPHOS 56 07/04/2020   BILITOT 0.5 07/04/2020   Lab Results  Component Value Date   HGBA1C 5.3 07/04/2020   HGBA1C 5.7 (H) 03/28/2020   HGBA1C 5.5 12/09/2017   HGBA1C 5.8 (H) 05/31/2014   Lab Results  Component Value Date   INSULIN 19.1 07/04/2020   INSULIN 28.7 (H) 03/28/2020   Lab Results  Component Value Date   TSH  0.78 06/15/2020   Lab Results  Component Value Date   CHOL 185 07/04/2020   HDL 51 07/04/2020   LDLCALC 117 (H) 07/04/2020   TRIG 95 07/04/2020   CHOLHDL 3.6 07/04/2020   Lab Results  Component Value Date   WBC 6.2 06/15/2020   HGB 15.6 06/15/2020   HCT 45.4 06/15/2020   MCV 93.0 06/15/2020   PLT 351 06/15/2020   No results found for: IRON, TIBC, FERRITIN  Attestation Statements:   Reviewed by clinician on day of visit: allergies, medications, problem list, medical history, surgical history, family history, social history, and previous encounter notes.  Time spent on visit including pre-visit chart review and post-visit care and charting was 28 minutes.   Edmund Hilda, am acting as Energy manager for William Hamburger, NP.  I have reviewed the above documentation for accuracy and completeness, and I agree with the above. -   d. , NP-C

## 2020-10-09 ENCOUNTER — Other Ambulatory Visit: Payer: Self-pay

## 2020-10-09 ENCOUNTER — Encounter (INDEPENDENT_AMBULATORY_CARE_PROVIDER_SITE_OTHER): Payer: Self-pay | Admitting: Adult Health

## 2020-10-09 ENCOUNTER — Ambulatory Visit (INDEPENDENT_AMBULATORY_CARE_PROVIDER_SITE_OTHER): Payer: Managed Care, Other (non HMO) | Admitting: Adult Health

## 2020-10-09 VITALS — BP 120/82 | HR 71 | Temp 98.1°F | Ht 68.0 in | Wt 213.0 lb

## 2020-10-09 DIAGNOSIS — E669 Obesity, unspecified: Secondary | ICD-10-CM

## 2020-10-09 DIAGNOSIS — M797 Fibromyalgia: Secondary | ICD-10-CM

## 2020-10-09 DIAGNOSIS — Z9189 Other specified personal risk factors, not elsewhere classified: Secondary | ICD-10-CM

## 2020-10-09 DIAGNOSIS — E559 Vitamin D deficiency, unspecified: Secondary | ICD-10-CM | POA: Diagnosis not present

## 2020-10-09 DIAGNOSIS — R7303 Prediabetes: Secondary | ICD-10-CM | POA: Diagnosis not present

## 2020-10-09 DIAGNOSIS — Z6832 Body mass index (BMI) 32.0-32.9, adult: Secondary | ICD-10-CM

## 2020-10-09 MED ORDER — VITAMIN D (ERGOCALCIFEROL) 1.25 MG (50000 UNIT) PO CAPS: 50000.0000 [IU] | ORAL_CAPSULE | ORAL | 0 refills | Status: DC

## 2020-10-10 NOTE — Progress Notes (Signed)
Chief Complaint:   OBESITY Adam Adams is here to discuss his progress with his obesity treatment plan along with follow-up of his obesity related diagnoses. Adam Adams is on keeping a food journal and adhering to recommended goals of 1800-2000 calories and 120 g protein and states he is following his eating plan approximately 70% of the time. Adam Adams states he is walking 30 minutes 7 times per week.  Today's visit was #: 13 Starting weight: 203 lbs Starting date: 03/28/2020 Today's weight: 213 lbs Today's date: 10/09/2020 Total lbs lost to date: 0 Total lbs lost since last in-office visit: 0  Interim History: Adam Adams discontinued duloxetine and Gabapentin 7 days ago. He denies increased fibromyalgia pain since stopping the medications. His wife is [redacted] weeks pregnant. They plan to have a gender reveal party next month.  He has been following 16/8 intermittent fasting plan, which has helped him to be able to consume more protein in a day then the 18/6 intermittent fasting timeline.  Subjective:   1. Pre-diabetes Adam Adams is on Metformin 500 mg 1/2 tab with breakfast and 1/2 tab at lunch. He has been inconsistent with taking the Metformin the last two weeks. He reports increased carb cravings since mid Jan.  Lab Results  Component Value Date   HGBA1C 5.3 07/04/2020   Lab Results  Component Value Date   INSULIN 19.1 07/04/2020   INSULIN 28.7 (H) 03/28/2020    2. Vitamin D deficiency Adam Adams's Vitamin D level was 42.0 on 07/04/2020. He is currently taking prescription vitamin D 50,000 IU each week. He denies nausea, vomiting or muscle weakness. Level is below goal of 50.   Ref. Range 07/04/2020 09:43  Vitamin D, 25-Hydroxy Latest Ref Range: 30.0 - 100.0 ng/mL 42.0   3. Fibromyalgia Adam Adams stopped taking duloxetine 30 mg approximately 7 days ago. He was on duloxetine 30 mg daily for approximately 30 day. He reports emotional bluntness while on duloxetine and he didn't like it. Pt denies suicidal or  homicidal ideations. He denies an increase in fibromyalgia pain since discontinuing duloxetine. He also stopped Gabapentin 7 days ago.   4. At risk for diabetes mellitus Adam Adams is at higher than average risk for developing diabetes due to obesity, pre-diabetes, and not consistently taking Metformin..   Assessment/Plan:   1. Pre-diabetes Adam Adams will continue to work on weight loss, exercise, and decreasing simple carbohydrates to help decrease the risk of diabetes. Set an alarm to remind himself to take Biguanides BID.  2. Vitamin D deficiency Low Vitamin D level contributes to fatigue and are associated with obesity, breast, and colon cancer. He agrees to continue to take prescription Vitamin D @50 ,000 IU every week and will follow-up for routine testing of Vitamin D, at least 2-3 times per year to avoid over-replacement.  - Vitamin D, Ergocalciferol, (DRISDOL) 1.25 MG (50000 UNIT) CAPS capsule; Take 1 capsule (50,000 Units total) by mouth every 7 (seven) days.  Dispense: 4 capsule; Refill: 0  3. Fibromyalgia Contact PCP, Dr. , about recently stopping duloxetine and Gabapentin.  4. At risk for diabetes mellitus Adam Adams was given approximately 15 minutes of diabetes education and counseling today. We discussed intensive lifestyle modifications today with an emphasis on weight loss as well as increasing exercise and decreasing simple carbohydrates in his diet. We also reviewed medication options with an emphasis on risk versus benefit of those discussed.   Repetitive spaced learning was employed today to elicit superior memory formation and behavioral change.  5. Class 1 obesity with serious  comorbidity and body mass index (BMI) of 32.0 to 32.9 in adult, unspecified obesity type Adam Adams is currently in the action stage of change. As such, his goal is to continue with weight loss efforts. He has agreed to keeping a food journal and adhering to recommended goals of 1800-2000 calories and  120 g protein.   Exercise goals: As is  Behavioral modification strategies: increasing lean protein intake, meal planning and cooking strategies, planning for success and keeping a strict food journal.  Adam Adams has agreed to follow-up with our clinic in 2 weeks. He was informed of the importance of frequent follow-up visits to maximize his success with intensive lifestyle modifications for his multiple health conditions.     Objective:   Blood pressure 120/82, pulse 71, temperature 98.1 F (36.7 C), height 5\' 8"  (1.727 m), weight 213 lb (96.6 kg), SpO2 99 %. Body mass index is 32.39 kg/m.  General: Cooperative, alert, well developed, in no acute distress. HEENT: Conjunctivae and lids unremarkable. Cardiovascular: Regular rhythm.  Lungs: Normal work of breathing. Neurologic: No focal deficits.   Lab Results  Component Value Date   CREATININE 0.86 07/04/2020   BUN 14 07/04/2020   NA 139 07/04/2020   K 4.5 07/04/2020   CL 104 07/04/2020   CO2 25 07/04/2020   Lab Results  Component Value Date   ALT 57 (H) 07/04/2020   AST 34 07/04/2020   ALKPHOS 56 07/04/2020   BILITOT 0.5 07/04/2020   Lab Results  Component Value Date   HGBA1C 5.3 07/04/2020   HGBA1C 5.7 (H) 03/28/2020   HGBA1C 5.5 12/09/2017   HGBA1C 5.8 (H) 05/31/2014   Lab Results  Component Value Date   INSULIN 19.1 07/04/2020   INSULIN 28.7 (H) 03/28/2020   Lab Results  Component Value Date   TSH 0.78 06/15/2020   Lab Results  Component Value Date   CHOL 185 07/04/2020   HDL 51 07/04/2020   LDLCALC 117 (H) 07/04/2020   TRIG 95 07/04/2020   CHOLHDL 3.6 07/04/2020   Lab Results  Component Value Date   WBC 6.2 06/15/2020   HGB 15.6 06/15/2020   HCT 45.4 06/15/2020   MCV 93.0 06/15/2020   PLT 351 06/15/2020   No results found for: IRON, TIBC, FERRITIN  Attestation Statements:   Reviewed by clinician on day of visit: allergies, medications, problem list, medical history, surgical history, family  history, social history, and previous encounter notes.  06/17/2020, am acting as Edmund Hilda for Energy manager, NP.  I have reviewed the above documentation for accuracy and completeness, and I agree with the above. -  Adam Adams d. Adam Beebe, NP-C

## 2020-10-26 ENCOUNTER — Ambulatory Visit (INDEPENDENT_AMBULATORY_CARE_PROVIDER_SITE_OTHER): Payer: Managed Care, Other (non HMO) | Admitting: Adult Health

## 2020-10-26 ENCOUNTER — Encounter (INDEPENDENT_AMBULATORY_CARE_PROVIDER_SITE_OTHER): Payer: Self-pay | Admitting: Adult Health

## 2020-10-26 ENCOUNTER — Other Ambulatory Visit: Payer: Self-pay

## 2020-10-26 VITALS — BP 119/74 | HR 97 | Temp 98.2°F | Ht 68.0 in | Wt 212.0 lb

## 2020-10-26 DIAGNOSIS — R7401 Elevation of levels of liver transaminase levels: Secondary | ICD-10-CM | POA: Diagnosis not present

## 2020-10-26 DIAGNOSIS — E782 Mixed hyperlipidemia: Secondary | ICD-10-CM

## 2020-10-26 DIAGNOSIS — E559 Vitamin D deficiency, unspecified: Secondary | ICD-10-CM

## 2020-10-26 DIAGNOSIS — Z9189 Other specified personal risk factors, not elsewhere classified: Secondary | ICD-10-CM

## 2020-10-26 DIAGNOSIS — R7303 Prediabetes: Secondary | ICD-10-CM | POA: Diagnosis not present

## 2020-10-26 DIAGNOSIS — Z6832 Body mass index (BMI) 32.0-32.9, adult: Secondary | ICD-10-CM

## 2020-10-26 DIAGNOSIS — E669 Obesity, unspecified: Secondary | ICD-10-CM

## 2020-10-26 MED ORDER — METFORMIN HCL 500 MG PO TABS
ORAL_TABLET | ORAL | 0 refills | Status: DC
Start: 2020-10-26 — End: 2020-11-28

## 2020-10-28 LAB — HEMOGLOBIN A1C
Est. average glucose Bld gHb Est-mCnc: 114 mg/dL
Hgb A1c MFr Bld: 5.6 % (ref 4.8–5.6)

## 2020-10-28 LAB — LIPID PANEL
Chol/HDL Ratio: 3.4 ratio (ref 0.0–5.0)
Cholesterol, Total: 170 mg/dL (ref 100–199)
HDL: 50 mg/dL (ref >39)
LDL Chol Calc (NIH): 104 mg/dL — ABNORMAL HIGH (ref 0–99)
Triglycerides: 86 mg/dL (ref 0–149)
VLDL Cholesterol Cal: 16 mg/dL (ref 5–40)

## 2020-10-28 LAB — COMPREHENSIVE METABOLIC PANEL
ALT: 63 IU/L — ABNORMAL HIGH (ref 0–44)
AST: 42 IU/L — ABNORMAL HIGH (ref 0–40)
Albumin/Globulin Ratio: 1.8 (ref 1.2–2.2)
Albumin: 4.6 g/dL (ref 4.0–5.0)
Alkaline Phosphatase: 55 IU/L (ref 44–121)
BUN/Creatinine Ratio: 17 (ref 9–20)
BUN: 14 mg/dL (ref 6–20)
Bilirubin Total: 0.6 mg/dL (ref 0.0–1.2)
CO2: 23 mmol/L (ref 20–29)
Calcium: 9.8 mg/dL (ref 8.7–10.2)
Chloride: 101 mmol/L (ref 96–106)
Creatinine, Ser: 0.81 mg/dL (ref 0.76–1.27)
GFR calc Af Amer: 137 mL/min/{1.73_m2} (ref >59)
GFR calc non Af Amer: 118 mL/min/{1.73_m2} (ref >59)
Globulin, Total: 2.6 g/dL (ref 1.5–4.5)
Glucose: 90 mg/dL (ref 65–99)
Potassium: 4.5 mmol/L (ref 3.5–5.2)
Sodium: 136 mmol/L (ref 134–144)
Total Protein: 7.2 g/dL (ref 6.0–8.5)

## 2020-10-28 LAB — INSULIN, RANDOM: INSULIN: 19.5 u[IU]/mL (ref 2.6–24.9)

## 2020-10-28 LAB — VITAMIN D 25 HYDROXY (VIT D DEFICIENCY, FRACTURES): Vit D, 25-Hydroxy: 32.3 ng/mL (ref 30.0–100.0)

## 2020-10-30 NOTE — Progress Notes (Signed)
Chief Complaint:   OBESITY Adam Adams is here to discuss his progress with his obesity treatment plan along with follow-up of his obesity related diagnoses. Adam Adams is on keeping a food journal and adhering to recommended goals of 1800-2000 calories and 120 g protein and states he is following his eating plan approximately 85% of the time. Adam Adams states he is doing 0 minutes 0 times per week.  Today's visit was #: 14 Starting weight: 203 lbs Starting date: 03/28/2020 Today's weight: 212 lbs Today's date: 10/26/2020 Total lbs lost to date: 0 Total lbs lost since last in-office visit: 1 lb  Interim History: Adam Adams feels that he is not tracking total food intake, preferring to focus on consuming at least 120 g protein/day. His wife will prepare most of his meals and she is trying to include higher protein foods.  Interval goal: Wake and get up when alarm goes off at 6AM.   Subjective:   1. Pre-diabetes Pt's A1c and insulin levels were both improved at his last lab check. He has been taking Metformin 500 mg one full tablet at a meal. He is not taking 1/2 tabs with breakfast or lunch.   2. Mixed hyperlipidemia Pt's LDL on 07/04/2020 was above goal.   Ref. Range 07/04/2020 09:43  LDL Chol Calc (NIH) Latest Ref Range: 0 - 99 mg/dL 409 (H)   3. Vitamin D deficiency Adam Adams's Vitamin D level was low at 42.0 on 07/04/2020. He is currently taking prescription vitamin D 50,000 IU each week. He denies nausea, vomiting or muscle weakness.   Ref. Range 07/04/2020 09:43  Vitamin D, 25-Hydroxy Latest Ref Range: 30.0 - 100.0 ng/mL 42.0    4. Transaminitis 07/04/2020 CMP resulted and elevated ALT of 57. He denies current abdominal pain.   Ref. Range 07/04/2020 09:43  ALT Latest Ref Range: 0 - 44 IU/L 57 (H)   5. At risk for diabetes mellitus Adam Adams is at higher than average risk for developing diabetes due to obesity and pre-diabetes.  Assessment/Plan:   1. Pre-diabetes Adam Adams will continue to work on  weight loss, exercise, and decreasing simple carbohydrates to help decrease the risk of diabetes. Check labs today.  - Hemoglobin A1c - Insulin, random - metFORMIN (GLUCOPHAGE) 500 MG tablet; 1 tablet with breakfast  Dispense: 30 tablet; Refill: 0  2. Mixed hyperlipidemia Cardiovascular risk and specific lipid/LDL goals reviewed.  We discussed several lifestyle modifications today and Adam Adams will continue to work on diet, exercise and weight loss efforts. Orders and follow up as documented in patient record. Check labs today.  Counseling Intensive lifestyle modifications are the first line treatment for this issue. . Dietary changes: Increase soluble fiber. Decrease simple carbohydrates. . Exercise changes: Moderate to vigorous-intensity aerobic activity 150 minutes per week if tolerated. . Lipid-lowering medications: see documented in medical record.  - Lipid panel  3. Vitamin D deficiency Low Vitamin D level contributes to fatigue and are associated with obesity, breast, and colon cancer. He agrees to continue to take prescription Vitamin D @50 ,000 IU every week and will follow-up for routine testing of Vitamin D, at least 2-3 times per year to avoid over-replacement. Check labs today.  - VITAMIN D 25 Hydroxy (Vit-D Deficiency, Fractures)  4. Transaminitis Check labs today.  - Comprehensive metabolic panel  5. At risk for diabetes mellitus Adam Adams was given approximately 15 minutes of diabetes education and counseling today. We discussed intensive lifestyle modifications today with an emphasis on weight loss as well as increasing exercise and  decreasing simple carbohydrates in his diet. We also reviewed medication options with an emphasis on risk versus benefit of those discussed.   Repetitive spaced learning was employed today to elicit superior memory formation and behavioral change.  6. Class 1 obesity with serious comorbidity and body mass index (BMI) of 32.0 to 32.9 in adult,  unspecified obesity type Adam Adams is currently in the action stage of change. As such, his goal is to continue with weight loss efforts. He has agreed to keeping a food journal and adhering to recommended goals of 1800-2000 calories and 120 g protein.   Track intake consistently.  Exercise goals: walking the dog  Behavioral modification strategies: increasing lean protein intake, meal planning and cooking strategies, keeping healthy foods in the home, planning for success and keeping a strict food journal.  Adam Adams has agreed to follow-up with our clinic in 2 weeks. He was informed of the importance of frequent follow-up visits to maximize his success with intensive lifestyle modifications for his multiple health conditions.   Adam Adams was informed we would discuss his lab results at his next visit unless there is a critical issue that needs to be addressed sooner. Adam Adams agreed to keep his next visit at the agreed upon time to discuss these results.  Objective:   Blood pressure 119/74, pulse 97, temperature 98.2 F (36.8 C), height 5\' 8"  (1.727 m), weight 212 lb (96.2 kg), SpO2 100 %. Body mass index is 32.23 kg/m.  General: Cooperative, alert, well developed, in no acute distress. HEENT: Conjunctivae and lids unremarkable. Cardiovascular: Regular rhythm.  Lungs: Normal work of breathing. Neurologic: No focal deficits.   Lab Results  Component Value Date   CREATININE 0.81 10/26/2020   BUN 14 10/26/2020   NA 136 10/26/2020   K 4.5 10/26/2020   CL 101 10/26/2020   CO2 23 10/26/2020   Lab Results  Component Value Date   ALT 63 (H) 10/26/2020   AST 42 (H) 10/26/2020   ALKPHOS 55 10/26/2020   BILITOT 0.6 10/26/2020   Lab Results  Component Value Date   HGBA1C 5.6 10/26/2020   HGBA1C 5.3 07/04/2020   HGBA1C 5.7 (H) 03/28/2020   HGBA1C 5.5 12/09/2017   HGBA1C 5.8 (H) 05/31/2014   Lab Results  Component Value Date   INSULIN 19.5 10/26/2020   INSULIN 19.1 07/04/2020   INSULIN  28.7 (H) 03/28/2020   Lab Results  Component Value Date   TSH 0.78 06/15/2020   Lab Results  Component Value Date   CHOL 170 10/26/2020   HDL 50 10/26/2020   LDLCALC 104 (H) 10/26/2020   TRIG 86 10/26/2020   CHOLHDL 3.4 10/26/2020   Lab Results  Component Value Date   WBC 6.2 06/15/2020   HGB 15.6 06/15/2020   HCT 45.4 06/15/2020   MCV 93.0 06/15/2020   PLT 351 06/15/2020    Attestation Statements:   Reviewed by clinician on day of visit: allergies, medications, problem list, medical history, surgical history, family history, social history, and previous encounter notes.  06/17/2020, am acting as Edmund Hilda for Energy manager, NP.  I have reviewed the above documentation for accuracy and completeness, and I agree with the above. -  Adam Adams d. Donne Robillard, NP-C

## 2020-11-09 ENCOUNTER — Other Ambulatory Visit (INDEPENDENT_AMBULATORY_CARE_PROVIDER_SITE_OTHER): Payer: Self-pay | Admitting: Adult Health

## 2020-11-09 DIAGNOSIS — R7303 Prediabetes: Secondary | ICD-10-CM

## 2020-11-15 ENCOUNTER — Ambulatory Visit (INDEPENDENT_AMBULATORY_CARE_PROVIDER_SITE_OTHER): Payer: Managed Care, Other (non HMO) | Admitting: Adult Health

## 2020-11-15 ENCOUNTER — Encounter (INDEPENDENT_AMBULATORY_CARE_PROVIDER_SITE_OTHER): Payer: Self-pay | Admitting: Adult Health

## 2020-11-15 ENCOUNTER — Other Ambulatory Visit: Payer: Self-pay

## 2020-11-15 VITALS — BP 119/70 | HR 64 | Temp 97.9°F | Ht 68.0 in | Wt 213.0 lb

## 2020-11-15 DIAGNOSIS — Z6832 Body mass index (BMI) 32.0-32.9, adult: Secondary | ICD-10-CM

## 2020-11-15 DIAGNOSIS — E782 Mixed hyperlipidemia: Secondary | ICD-10-CM | POA: Diagnosis not present

## 2020-11-15 DIAGNOSIS — E669 Obesity, unspecified: Secondary | ICD-10-CM

## 2020-11-15 DIAGNOSIS — E559 Vitamin D deficiency, unspecified: Secondary | ICD-10-CM

## 2020-11-15 DIAGNOSIS — R7303 Prediabetes: Secondary | ICD-10-CM

## 2020-11-15 DIAGNOSIS — R7401 Elevation of levels of liver transaminase levels: Secondary | ICD-10-CM | POA: Diagnosis not present

## 2020-11-16 NOTE — Progress Notes (Signed)
Chief Complaint:   OBESITY Adam Adams is here to discuss his progress with his obesity treatment plan along with follow-up of his obesity related diagnoses. Adam Adams is on keeping a food journal and adhering to recommended goals of 1800-2000 calories and 120 g protein and states he is following his eating plan approximately 75% of the time. Adam Adams states he is running 30 minutes 7 times per week.  Today's visit was #: 15 Starting weight: 203 lbs Starting date: 03/28/2020 Today's weight: 213 lbs Today's date: 11/15/2020 Total lbs lost to date: 0 Total lbs lost since last in-office visit: 0  Interim History: Adam Adams participated in a 48 hour fast last week. He denies severe hunger during the fast. He has been focusing on protein intake 90-110 g/day. He continues to follow 16/8 intermittent fasting. His wife prepares and tracks most of his meals.  Subjective:   1. Pre-diabetes Worsening. Discussed labs with patient today. [name] increased from 5.3 on 07/04/2020 to 5.6 on 10/26/2020. He had a normal blood glucose and an elevated insulin level at 19.5. he is on Metformin 500 mg QD. He will not take on full fasting days.  Lab Results  Component Value Date   HGBA1C 5.6 10/26/2020   Lab Results  Component Value Date   INSULIN 19.5 10/26/2020   INSULIN 19.1 07/04/2020   INSULIN 28.7 (H) 03/28/2020    2. Vitamin D deficiency Discussed labs with patient today. Adam Adams's Vitamin D level was below goal of 50, at 32.3 on 10/26/2020. He is currently taking prescription vitamin D 50,000 IU each week. He denies nausea, vomiting or muscle weakness.   Ref. Range 10/26/2020 08:10  Vitamin D, 25-Hydroxy Latest Ref Range: 30.0 - 100.0 ng/mL 32.3   3. Transaminitis Worsening. Discussed labs with patient today. 10/26/2020 CMP resulted elevated LFTs. Adam Adams will intermittently experience RUQ pain- 8/10 stabbing pain that will last a few seconds- will self resolve.  He denies excessive ETOH or acetaminophen.   03/13/2020 CT with [facility]: Fatty Infiltration of the Liver.   Lab Results  Component Value Date   ALT 63 (H) 10/26/2020   AST 42 (H) 10/26/2020   ALKPHOS 55 10/26/2020   BILITOT 0.6 10/26/2020    4. Mixed hyperlipidemia Discussed labs with patient today. 10/26/2020 lipid panel resulted LDL improving, however, still above goal- LDL 104. The rest of the lipid panel is stable.  Lab Results  Component Value Date   ALT 63 (H) 10/26/2020   AST 42 (H) 10/26/2020   ALKPHOS 55 10/26/2020   BILITOT 0.6 10/26/2020   Lab Results  Component Value Date   CHOL 170 10/26/2020   HDL 50 10/26/2020   LDLCALC 104 (H) 10/26/2020   TRIG 86 10/26/2020   CHOLHDL 3.4 10/26/2020    Assessment/Plan:   1. Pre-diabetes Adam Adams will continue to work on weight loss, exercise, and decreasing simple carbohydrates to help decrease the risk of diabetes.  Continue daily Metformin 500 mg.  2. Vitamin D deficiency Low Vitamin D level contributes to fatigue and are associated with obesity, breast, and colon cancer. He agrees to continue to take prescription Vitamin D @50 ,000 IU every week and will follow-up for routine testing of Vitamin D, at least 2-3 times per year to avoid over-replacement.  3. Transaminitis Avoid hepatotoxic substances. Consider imaging if RUQ pain increases or LFT's increase at next CMP check.  4. Mixed hyperlipidemia Cardiovascular risk and specific lipid/LDL goals reviewed.  We discussed several lifestyle modifications today and Adam Adams will continue to  work on diet, exercise and weight loss efforts. Orders and follow up as documented in patient record.   Counseling Intensive lifestyle modifications are the first line treatment for this issue. . Dietary changes: Increase soluble fiber. Decrease simple carbohydrates. . Exercise changes: Moderate to vigorous-intensity aerobic activity 150 minutes per week if tolerated. . Lipid-lowering medications: see documented in medical record.  5.  Class 1 obesity with serious comorbidity and body mass index (BMI) of 32.0 to 32.9 in adult, unspecified obesity type Adam Adams is currently in the action stage of change. As such, his goal is to continue with weight loss efforts. He has agreed to keeping a food journal and adhering to recommended goals of 1800-2000 calories and 120 g protein.   Eat everyday and strive to hit outlined cal/protein goals. Fasting labs/IC at next OV.  Exercise goals: As is  Behavioral modification strategies: increasing lean protein intake, no skipping meals, meal planning and cooking strategies and planning for success.  Adam Adams has agreed to follow-up with our clinic in 2 weeks. He was informed of the importance of frequent follow-up visits to maximize his success with intensive lifestyle modifications for his multiple health conditions.   Objective:   Blood pressure 119/70, pulse 64, temperature 97.9 F (36.6 C), height 5\' 8"  (1.727 m), weight 213 lb (96.6 kg), SpO2 100 %. Body mass index is 32.39 kg/m.  General: Cooperative, alert, well developed, in no acute distress. HEENT: Conjunctivae and lids unremarkable. Cardiovascular: Regular rhythm.  Lungs: Normal work of breathing. Neurologic: No focal deficits.   Lab Results  Component Value Date   CREATININE 0.81 10/26/2020   BUN 14 10/26/2020   NA 136 10/26/2020   K 4.5 10/26/2020   CL 101 10/26/2020   CO2 23 10/26/2020   Lab Results  Component Value Date   ALT 63 (H) 10/26/2020   AST 42 (H) 10/26/2020   ALKPHOS 55 10/26/2020   BILITOT 0.6 10/26/2020   Lab Results  Component Value Date   HGBA1C 5.6 10/26/2020   HGBA1C 5.3 07/04/2020   HGBA1C 5.7 (H) 03/28/2020   HGBA1C 5.5 12/09/2017   HGBA1C 5.8 (H) 05/31/2014   Lab Results  Component Value Date   INSULIN 19.5 10/26/2020   INSULIN 19.1 07/04/2020   INSULIN 28.7 (H) 03/28/2020   Lab Results  Component Value Date   TSH 0.78 06/15/2020   Lab Results  Component Value Date   CHOL 170  10/26/2020   HDL 50 10/26/2020   LDLCALC 104 (H) 10/26/2020   TRIG 86 10/26/2020   CHOLHDL 3.4 10/26/2020   Lab Results  Component Value Date   WBC 6.2 06/15/2020   HGB 15.6 06/15/2020   HCT 45.4 06/15/2020   MCV 93.0 06/15/2020   PLT 351 06/15/2020    Attestation Statements:   Reviewed by clinician on day of visit: allergies, medications, problem list, medical history, surgical history, family history, social history, and previous encounter notes.  Time spent on visit including pre-visit chart review and post-visit care and charting was 34 minutes.   06/17/2020, am acting as Edmund Hilda for Energy manager, NP.  I have reviewed the above documentation for accuracy and completeness, and I agree with the above. -  Demarri Elie d. Terrin Imparato, NP-C

## 2020-11-28 NOTE — Telephone Encounter (Signed)
Refill request

## 2020-12-11 ENCOUNTER — Encounter (INDEPENDENT_AMBULATORY_CARE_PROVIDER_SITE_OTHER): Payer: Self-pay | Admitting: Adult Health

## 2020-12-11 ENCOUNTER — Ambulatory Visit (INDEPENDENT_AMBULATORY_CARE_PROVIDER_SITE_OTHER): Payer: Managed Care, Other (non HMO) | Admitting: Adult Health

## 2020-12-11 ENCOUNTER — Other Ambulatory Visit: Payer: Self-pay

## 2020-12-11 VITALS — BP 117/77 | HR 62 | Temp 97.7°F | Ht 68.0 in | Wt 217.0 lb

## 2020-12-11 DIAGNOSIS — E88819 Insulin resistance, unspecified: Secondary | ICD-10-CM

## 2020-12-11 DIAGNOSIS — R0789 Other chest pain: Secondary | ICD-10-CM | POA: Insufficient documentation

## 2020-12-11 DIAGNOSIS — E8881 Metabolic syndrome: Secondary | ICD-10-CM | POA: Diagnosis not present

## 2020-12-11 DIAGNOSIS — Z6830 Body mass index (BMI) 30.0-30.9, adult: Secondary | ICD-10-CM

## 2020-12-11 DIAGNOSIS — E669 Obesity, unspecified: Secondary | ICD-10-CM

## 2020-12-11 DIAGNOSIS — Z9189 Other specified personal risk factors, not elsewhere classified: Secondary | ICD-10-CM | POA: Diagnosis not present

## 2020-12-11 DIAGNOSIS — R0602 Shortness of breath: Secondary | ICD-10-CM

## 2020-12-11 DIAGNOSIS — E559 Vitamin D deficiency, unspecified: Secondary | ICD-10-CM

## 2020-12-12 NOTE — Progress Notes (Signed)
Chief Complaint:   OBESITY Adam Adams is here to discuss his progress with his obesity treatment plan along with follow-up of his obesity related diagnoses. Talbert is on keeping a food journal and adhering to recommended goals of 1800-2000 calories and 120 g protein and states he is following his eating plan approximately 75% of the time. [name] states he is doing cardio 30 minutes 3-5 times per week.  Today's visit was #: 16 Starting weight: 203 lbs Starting date: 03/28/2020 Today's weight: 217 lbs Today's date: 12/11/2020 Total lbs lost to date: 0 Total lbs lost since last in-office visit: 0  Interim History: Adam Adams feels he will hit protein goal 120 g protein per day at least 75% of the time.  He has changed fasting times- 18/6 or 19/5. He passed his [employer] Test last weekend- hooray!  Subjective:   1. Vitamin D deficiency [name] level was 32.3 on 10/26/2020. He is currently taking prescription vitamin D 50,000 IU each week. He denies nausea, vomiting or muscle weakness.   Ref. Range 10/26/2020 08:10  Vitamin D, 25-Hydroxy Latest Ref Range: 30.0 - 100.0 ng/mL 32.3    2. Insulin resistance Adam Adams has changed Metformin 500 mg from breakfast to lunch to account for intermittent fasting.  Lab Results  Component Value Date   INSULIN 19.5 10/26/2020   INSULIN 19.1 07/04/2020   INSULIN 28.7 (H) 03/28/2020   Lab Results  Component Value Date   HGBA1C 5.6 10/26/2020    3. SOB (shortness of breath) on exertion Amogh reports SOB with extreme exertion. He denies chest pain with exertion.  IC checked today and RMR has increased to 2607, initial RMR 2586 on 03/14/2020.  4. At risk for diabetes mellitus Rodell is at higher than average risk for developing diabetes due to obesity and insulin resistance.  Assessment/Plan:   1. Vitamin D deficiency Low Vitamin D level contributes to fatigue and are associated with obesity, breast, and colon cancer. He agrees to  continue to take prescription Vitamin D @50 ,000 IU every week and will follow-up for routine testing of Vitamin D, at least 2-3 times per year to avoid over-replacement.  2. Insulin resistance Adam Adams will continue to work on weight loss, exercise, and decreasing simple carbohydrates to help decrease the risk of diabetes. Adam Adams agreed to follow-up with as directed to closely monitor his progress. Continue Metformin 500 mg QD.  3. SOB (shortness of breath) on exertion Adam Adams does feel that he gets out of breath more easily that he used to when he exercises. Adam Adams's shortness of breath appears to be obesity related and exercise induced. He has agreed to work on weight loss and gradually increase exercise to treat his exercise induced shortness of breath. Will continue to monitor closely. Check IC today.  4. At risk for diabetes mellitus Adam Adams was given approximately 15 minutes of diabetes education and counseling today. We discussed intensive lifestyle modifications today with an emphasis on weight loss as well as increasing exercise and decreasing simple carbohydrates in his diet. We also reviewed medication options with an emphasis on risk versus benefit of those discussed.   Repetitive spaced learning was employed today to elicit superior memory formation and behavioral change.  5. Current BMI 33.1 [name] is currently in the action stage of change. As such, his goal is to continue with weight loss efforts. He has agreed to keeping a food journal and adhering to recommended goals of 1800-2000 calories and 120 g protein.  Exercise goals: As is  Behavioral modification strategies: increasing lean protein intake, decreasing simple carbohydrates, no skipping meals, meal planning and cooking strategies, keeping healthy foods in the home, planning for success and keeping a strict food journal.  Adam Adams has agreed to follow-up with our clinic in 3 weeks. He was informed of the importance of frequent  follow-up visits to maximize his success with intensive lifestyle modifications for his multiple health conditions.   Objective:   Blood pressure 117/77, pulse 62, temperature 97.7 F (36.5 C), height 5\' 8"  (1.727 m), weight 217 lb (98.4 kg), SpO2 99 %. Body mass index is 32.99 kg/m.  General: Cooperative, alert, well developed, in no acute distress. HEENT: Conjunctivae and lids unremarkable. Cardiovascular: Regular rhythm.  Lungs: Normal work of breathing. Neurologic: No focal deficits.   Lab Results  Component Value Date   CREATININE 0.81 10/26/2020   BUN 14 10/26/2020   NA 136 10/26/2020   K 4.5 10/26/2020   CL 101 10/26/2020   CO2 23 10/26/2020   Lab Results  Component Value Date   ALT 63 (H) 10/26/2020   AST 42 (H) 10/26/2020   ALKPHOS 55 10/26/2020   BILITOT 0.6 10/26/2020   Lab Results  Component Value Date   HGBA1C 5.6 10/26/2020   HGBA1C 5.3 07/04/2020   HGBA1C 5.7 (H) 03/28/2020   HGBA1C 5.5 12/09/2017   HGBA1C 5.8 (H) 05/31/2014   Lab Results  Component Value Date   INSULIN 19.5 10/26/2020   INSULIN 19.1 07/04/2020   INSULIN 28.7 (H) 03/28/2020   Lab Results  Component Value Date   TSH 0.78 06/15/2020   Lab Results  Component Value Date   CHOL 170 10/26/2020   HDL 50 10/26/2020   LDLCALC 104 (H) 10/26/2020   TRIG 86 10/26/2020   CHOLHDL 3.4 10/26/2020   Lab Results  Component Value Date   WBC 6.2 06/15/2020   HGB 15.6 06/15/2020   HCT 45.4 06/15/2020   MCV 93.0 06/15/2020   PLT 351 06/15/2020    Attestation Statements:   Reviewed by clinician on day of visit: allergies, medications, problem list, medical history, surgical history, family history, social history, and previous encounter notes.  06/17/2020, am acting as Edmund Hilda for Energy manager, NP.  I have reviewed the above documentation for accuracy and completeness, and I agree with the above. -  Firman Petrow d. Sulay Brymer, NP-C

## 2021-01-02 ENCOUNTER — Ambulatory Visit (INDEPENDENT_AMBULATORY_CARE_PROVIDER_SITE_OTHER): Payer: Managed Care, Other (non HMO) | Admitting: Adult Health

## 2021-01-02 ENCOUNTER — Other Ambulatory Visit: Payer: Self-pay

## 2021-01-02 ENCOUNTER — Encounter (INDEPENDENT_AMBULATORY_CARE_PROVIDER_SITE_OTHER): Payer: Self-pay | Admitting: Adult Health

## 2021-01-02 VITALS — BP 114/72 | HR 76 | Temp 98.1°F | Ht 68.0 in | Wt 213.0 lb

## 2021-01-02 DIAGNOSIS — E782 Mixed hyperlipidemia: Secondary | ICD-10-CM

## 2021-01-02 DIAGNOSIS — E559 Vitamin D deficiency, unspecified: Secondary | ICD-10-CM

## 2021-01-02 DIAGNOSIS — Z9189 Other specified personal risk factors, not elsewhere classified: Secondary | ICD-10-CM

## 2021-01-02 DIAGNOSIS — R7303 Prediabetes: Secondary | ICD-10-CM | POA: Diagnosis not present

## 2021-01-02 DIAGNOSIS — E669 Obesity, unspecified: Secondary | ICD-10-CM

## 2021-01-02 DIAGNOSIS — R7401 Elevation of levels of liver transaminase levels: Secondary | ICD-10-CM

## 2021-01-02 DIAGNOSIS — Z6832 Body mass index (BMI) 32.0-32.9, adult: Secondary | ICD-10-CM

## 2021-01-02 MED ORDER — VITAMIN D (ERGOCALCIFEROL) 1.25 MG (50000 UNIT) PO CAPS: 50000.0000 [IU] | ORAL_CAPSULE | ORAL | 0 refills | Status: DC

## 2021-01-03 LAB — COMPREHENSIVE METABOLIC PANEL
ALT: 62 IU/L — ABNORMAL HIGH (ref 0–44)
AST: 37 IU/L (ref 0–40)
Albumin/Globulin Ratio: 1.7 (ref 1.2–2.2)
Albumin: 4.7 g/dL (ref 4.0–5.0)
Alkaline Phosphatase: 56 IU/L (ref 44–121)
BUN/Creatinine Ratio: 14 (ref 9–20)
BUN: 13 mg/dL (ref 6–20)
Bilirubin Total: 0.5 mg/dL (ref 0.0–1.2)
CO2: 24 mmol/L (ref 20–29)
Calcium: 9.7 mg/dL (ref 8.7–10.2)
Chloride: 102 mmol/L (ref 96–106)
Creatinine, Ser: 0.94 mg/dL (ref 0.76–1.27)
Globulin, Total: 2.7 g/dL (ref 1.5–4.5)
Glucose: 80 mg/dL (ref 65–99)
Potassium: 4.2 mmol/L (ref 3.5–5.2)
Sodium: 140 mmol/L (ref 134–144)
Total Protein: 7.4 g/dL (ref 6.0–8.5)
eGFR: 111 mL/min/{1.73_m2} (ref >59)

## 2021-01-03 LAB — INSULIN, RANDOM: INSULIN: 12.5 u[IU]/mL (ref 2.6–24.9)

## 2021-01-03 LAB — HEMOGLOBIN A1C
Est. average glucose Bld gHb Est-mCnc: 111 mg/dL
Hgb A1c MFr Bld: 5.5 % (ref 4.8–5.6)

## 2021-01-03 LAB — LIPID PANEL
Chol/HDL Ratio: 3.1 ratio (ref 0.0–5.0)
Cholesterol, Total: 160 mg/dL (ref 100–199)
HDL: 51 mg/dL (ref >39)
LDL Chol Calc (NIH): 96 mg/dL (ref 0–99)
Triglycerides: 68 mg/dL (ref 0–149)
VLDL Cholesterol Cal: 13 mg/dL (ref 5–40)

## 2021-01-03 LAB — VITAMIN D 25 HYDROXY (VIT D DEFICIENCY, FRACTURES): Vit D, 25-Hydroxy: 44.7 ng/mL (ref 30.0–100.0)

## 2021-01-03 NOTE — Progress Notes (Signed)
Chief Complaint:   OBESITY Adam Adams is here to discuss his progress with his obesity treatment plan along with follow-up of his obesity related diagnoses. Adam Adams is on keeping a food journal and adhering to recommended goals of 1800-2000 calories and 120 g protein and states he is following his eating plan approximately 5% of the time. Adam Adams states he is doing cardio 30 minutes 7 times per week.  Today's visit was #: 17 Starting weight: 203 lbs Starting date: 03/28/2020 Today's weight: 213 lbs Today's date: 01/02/2021 Total lbs lost to date: 0 Total lbs lost since last in-office visit: 4  Interim History: Yojan followed intermittent fasting the last several weeks. 5 days on journaling plan. 2 days (either Tuesday/Wednesday or Wednesday/Thursday) fast during daytime then 500 cal at dinner. He denies hypoglycemic sx's with this eating format.  Subjective:   1. Vitamin D deficiency Adam Adams's Vitamin D level was 32.3 on 10/26/2020, which is well below goal of 50.Marland Kitchen He is currently taking prescription vitamin D 50,000 IU each week. He denies nausea, vomiting or muscle weakness.  2. Transaminitis Adam Adams denies current abdominal pain or jaundice.  3. Pre-diabetes A1c has been as high as 5.7 per Epic review. Adam Adams will take Metformin 500 mg QD, 3-4 times per week.  4. Mixed hyperlipidemia 10/26/2020 lipid panel resulted LDL slightly above goal and HDL 50. Adam Adams is not on statin therapy.  5. At risk for impaired function of liver Danis is at risk for impaired function of liver due to likely diagnosis of fatty liver as evidenced by recent elevated liver enzymes.   Assessment/Plan:   1. Vitamin D deficiency Low Vitamin D level contributes to fatigue and are associated with obesity, breast, and colon cancer. He agrees to continue to take prescription Vitamin D @50 ,000 IU every week and will follow-up for routine testing of Vitamin D, at least 2-3 times per year to avoid over-replacement. Check labs  today.  - Vitamin D, Ergocalciferol, (DRISDOL) 1.25 MG (50000 UNIT) CAPS capsule; Take 1 capsule (50,000 Units total) by mouth every 7 (seven) days.  Dispense: 4 capsule; Refill: 0  - VITAMIN D 25 Hydroxy (Vit-D Deficiency, Fractures)  2. Transaminitis Check labs today.  - Comprehensive metabolic panel  3. Pre-diabetes Adam Adams will continue to work on weight loss, exercise, and decreasing simple carbohydrates to help decrease the risk of diabetes. Check labs today.  - Comprehensive metabolic panel - Hemoglobin A1c - Insulin, random  4. Mixed hyperlipidemia Cardiovascular risk and specific lipid/LDL goals reviewed.  We discussed several lifestyle modifications today and Adam Adams will continue to work on diet, exercise and weight loss efforts. Orders and follow up as documented in patient record. Check labs today.  Counseling Intensive lifestyle modifications are the first line treatment for this issue. . Dietary changes: Increase soluble fiber. Decrease simple carbohydrates. . Exercise changes: Moderate to vigorous-intensity aerobic activity 150 minutes per week if tolerated. . Lipid-lowering medications: see documented in medical record.  - Lipid panel  5. At risk for impaired function of liver Adam Adams was given approximately 15 minutes of counseling today regarding prevention of impaired liver function. Adam Adams was educated about his risk of developing NASH or even liver failure and advised that the only proven treatment for NAFLD was weight loss of at least 5-10% of body weight.   6. Class 1 obesity with serious comorbidity and body mass index (BMI) of 32.0 to 32.9 in adult, unspecified obesity type  Adam Adams is currently in the action stage of change. As  such, his goal is to continue with weight loss efforts. He has agreed to keeping a food journal and adhering to recommended goals of 1800-2000 calories and 120 g protein and intermittent fasting.   Exercise goals: As is  Behavioral  modification strategies: increasing lean protein intake, decreasing simple carbohydrates, increasing water intake, meal planning and cooking strategies, planning for success and keeping a strict food journal.  Adam Adams has agreed to follow-up with our clinic in 2 weeks. He was informed of the importance of frequent follow-up visits to maximize his success with intensive lifestyle modifications for his multiple health conditions.   Adam Adams was informed we would discuss his lab results at his next visit unless there is a critical issue that needs to be addressed sooner. Adam Adams agreed to keep his next visit at the agreed upon time to discuss these results.  Objective:   Blood pressure 114/72, pulse 76, temperature 98.1 F (36.7 C), height 5\' 8"  (1.727 m), weight 213 lb (96.6 kg), SpO2 98 %. Body mass index is 32.39 kg/m.  General: Cooperative, alert, well developed, in no acute distress. HEENT: Conjunctivae and lids unremarkable. Cardiovascular: Regular rhythm.  Lungs: Normal work of breathing. Neurologic: No focal deficits.   Lab Results  Component Value Date   CREATININE 0.94 01/02/2021   BUN 13 01/02/2021   NA 140 01/02/2021   K 4.2 01/02/2021   CL 102 01/02/2021   CO2 24 01/02/2021   Lab Results  Component Value Date   ALT 62 (H) 01/02/2021   AST 37 01/02/2021   ALKPHOS 56 01/02/2021   BILITOT 0.5 01/02/2021   Lab Results  Component Value Date   HGBA1C 5.5 01/02/2021   HGBA1C 5.6 10/26/2020   HGBA1C 5.3 07/04/2020   HGBA1C 5.7 (H) 03/28/2020   HGBA1C 5.5 12/09/2017   Lab Results  Component Value Date   INSULIN 12.5 01/02/2021   INSULIN 19.5 10/26/2020   INSULIN 19.1 07/04/2020   INSULIN 28.7 (H) 03/28/2020   Lab Results  Component Value Date   TSH 0.78 06/15/2020   Lab Results  Component Value Date   CHOL 160 01/02/2021   HDL 51 01/02/2021   LDLCALC 96 01/02/2021   TRIG 68 01/02/2021   CHOLHDL 3.1 01/02/2021   Lab Results  Component Value Date   WBC 6.2  06/15/2020   HGB 15.6 06/15/2020   HCT 45.4 06/15/2020   MCV 93.0 06/15/2020   PLT 351 06/15/2020     Attestation Statements:   Reviewed by clinician on day of visit: allergies, medications, problem list, medical history, surgical history, family history, social history, and previous encounter notes.  06/17/2020, am acting as Edmund Hilda for Energy manager, NP.  I have reviewed the above documentation for accuracy and completeness, and I agree with the above. -  Katy d. Danford, NP-C

## 2021-01-17 ENCOUNTER — Encounter (INDEPENDENT_AMBULATORY_CARE_PROVIDER_SITE_OTHER): Payer: Self-pay | Admitting: Adult Health

## 2021-01-17 ENCOUNTER — Ambulatory Visit (INDEPENDENT_AMBULATORY_CARE_PROVIDER_SITE_OTHER): Admitting: Adult Health

## 2021-01-17 ENCOUNTER — Other Ambulatory Visit: Payer: Self-pay

## 2021-01-17 VITALS — BP 123/75 | HR 66 | Temp 98.4°F | Ht 68.0 in | Wt 213.0 lb

## 2021-01-17 DIAGNOSIS — R7303 Prediabetes: Secondary | ICD-10-CM

## 2021-01-17 DIAGNOSIS — E669 Obesity, unspecified: Secondary | ICD-10-CM

## 2021-01-17 DIAGNOSIS — E559 Vitamin D deficiency, unspecified: Secondary | ICD-10-CM | POA: Diagnosis not present

## 2021-01-17 DIAGNOSIS — R7401 Elevation of levels of liver transaminase levels: Secondary | ICD-10-CM | POA: Diagnosis not present

## 2021-01-17 DIAGNOSIS — Z6832 Body mass index (BMI) 32.0-32.9, adult: Secondary | ICD-10-CM

## 2021-01-18 NOTE — Progress Notes (Signed)
Chief Complaint:   OBESITY Adam Adams is here to discuss his progress with his obesity treatment plan along with follow-up of his obesity related diagnoses. Adam Adams is on keeping a food journal and adhering to recommended goals of 1800-2000 calories and 120 g protein and states he is following his eating plan approximately 80% of the time. Adam Adams states he is running 30 minutes 5 times per week.  Today's visit was #: 18 Starting weight: 203 lbs Starting date: 03/28/2020 Today's weight: 213 lbs Today's date: 01/17/2021 Total lbs lost to date: 0 Total lbs lost since last in-office visit: 0  Interim History: [name] has continued to do various forms of intermittent fasting (IF).  He will keep calories to <500 = 1-2 day per week. He will IF 1200-1400 to 1900-2000 5 days per week.  Subjective:   1. Pre-diabetes Discussed labs with patient today. 01/02/2021 BG 80, A1c 5.5, and insulin level 12.5.  01/02/2021 CMP- GFR >60. Adam Adams is on Metformin 500 mg QD. He estimates to use dose 1-2 times per week.  Lab Results  Component Value Date   HGBA1C 5.5 01/02/2021   Lab Results  Component Value Date   INSULIN 12.5 01/02/2021   INSULIN 19.5 10/26/2020   INSULIN 19.1 07/04/2020   INSULIN 28.7 (H) 03/28/2020    2. Vitamin D deficiency Discussed labs with patient today. [name]'s Vitamin D level was 44.7 on 01/02/2021, which is below goal of 50. He is currently taking prescription vitamin D 50,000 IU each week. He denies nausea, vomiting or muscle weakness.  3. Transaminitis Discussed labs with patient today. 01/02/2021 CMP- AST improved from last check- current AST 37 and on 10/26/2020 AST 42. ALT slightly improved on 01/02/2021 ALT 62 and on 10/26/2020 ALT 63. Lesslie denies current abdominal pain.  Lab Results  Component Value Date   ALT 62 (H) 01/02/2021   AST 37 01/02/2021   ALKPHOS 56 01/02/2021   BILITOT 0.5 01/02/2021    Assessment/Plan:   1. Pre-diabetes Adam Adams will continue to work on  weight loss, exercise, and decreasing simple carbohydrates to help decrease the risk of diabetes.  -Try to take Metformin daily with first meal of the day.  2. Vitamin D deficiency Low Vitamin D level contributes to fatigue and are associated with obesity, breast, and colon cancer. He agrees to continue to take prescription Vitamin D @50 ,000 IU every week and will follow-up for routine testing of Vitamin D, at least 2-3 times per year to avoid over-replacement.  3. Transaminitis Continue to monitor labs.  4. Class 1 obesity with serious comorbidity and body mass index (BMI) of 32.0 to 32.9 in adult, unspecified obesity type Adam Adams is currently in the action stage of change. As such, his goal is to continue with weight loss efforts. He has agreed to keeping a food journal and adhering to recommended goals of 1800-2000 calories and 120 g protein.   Exercise goals: As is  Behavioral modification strategies: increasing lean protein intake, decreasing simple carbohydrates, meal planning and cooking strategies, planning for success and keeping a strict food journal.  Adam Adams has agreed to follow-up with our clinic in 3 weeks. He was informed of the importance of frequent follow-up visits to maximize his success with intensive lifestyle modifications for his multiple health conditions.   Objective:   Blood pressure 123/75, pulse 66, temperature 98.4 F (36.9 C), height 5\' 8"  (1.727 m), weight 213 lb (96.6 kg), SpO2 98 %. Body mass index is 32.39 kg/m.  General: Cooperative,  alert, well developed, in no acute distress. HEENT: Conjunctivae and lids unremarkable. Cardiovascular: Regular rhythm.  Lungs: Normal work of breathing. Neurologic: No focal deficits.   Lab Results  Component Value Date   CREATININE 0.94 01/02/2021   BUN 13 01/02/2021   NA 140 01/02/2021   K 4.2 01/02/2021   CL 102 01/02/2021   CO2 24 01/02/2021   Lab Results  Component Value Date   ALT 62 (H) 01/02/2021   AST 37  01/02/2021   ALKPHOS 56 01/02/2021   BILITOT 0.5 01/02/2021   Lab Results  Component Value Date   HGBA1C 5.5 01/02/2021   HGBA1C 5.6 10/26/2020   HGBA1C 5.3 07/04/2020   HGBA1C 5.7 (H) 03/28/2020   HGBA1C 5.5 12/09/2017   Lab Results  Component Value Date   INSULIN 12.5 01/02/2021   INSULIN 19.5 10/26/2020   INSULIN 19.1 07/04/2020   INSULIN 28.7 (H) 03/28/2020   Lab Results  Component Value Date   TSH 0.78 06/15/2020   Lab Results  Component Value Date   CHOL 160 01/02/2021   HDL 51 01/02/2021   LDLCALC 96 01/02/2021   TRIG 68 01/02/2021   CHOLHDL 3.1 01/02/2021   Lab Results  Component Value Date   WBC 6.2 06/15/2020   HGB 15.6 06/15/2020   HCT 45.4 06/15/2020   MCV 93.0 06/15/2020   PLT 351 06/15/2020   No results found for: IRON, TIBC, FERRITIN  Attestation Statements:   Reviewed by clinician on day of visit: allergies, medications, problem list, medical history, surgical history, family history, social history, and previous encounter notes.  Edmund Hilda, CMA, am acting as transcriptionist for William Hamburger, NP.  I have reviewed the above documentation for accuracy and completeness, and I agree with the above. -  Gini Caputo d. Vivianne Carles, NP-C

## 2021-02-08 ENCOUNTER — Ambulatory Visit (INDEPENDENT_AMBULATORY_CARE_PROVIDER_SITE_OTHER): Admitting: Adult Health

## 2021-02-13 ENCOUNTER — Ambulatory Visit (INDEPENDENT_AMBULATORY_CARE_PROVIDER_SITE_OTHER): Admitting: Adult Health

## 2021-02-13 ENCOUNTER — Other Ambulatory Visit: Payer: Self-pay

## 2021-02-13 ENCOUNTER — Encounter (INDEPENDENT_AMBULATORY_CARE_PROVIDER_SITE_OTHER): Payer: Self-pay | Admitting: Adult Health

## 2021-02-13 VITALS — BP 109/69 | HR 62 | Temp 97.6°F | Ht 68.0 in | Wt 212.0 lb

## 2021-02-13 DIAGNOSIS — E669 Obesity, unspecified: Secondary | ICD-10-CM | POA: Diagnosis not present

## 2021-02-13 DIAGNOSIS — E559 Vitamin D deficiency, unspecified: Secondary | ICD-10-CM

## 2021-02-13 DIAGNOSIS — Z6830 Body mass index (BMI) 30.0-30.9, adult: Secondary | ICD-10-CM | POA: Diagnosis not present

## 2021-02-13 DIAGNOSIS — Z9189 Other specified personal risk factors, not elsewhere classified: Secondary | ICD-10-CM

## 2021-02-13 DIAGNOSIS — R7303 Prediabetes: Secondary | ICD-10-CM

## 2021-02-13 MED ORDER — VITAMIN D (ERGOCALCIFEROL) 1.25 MG (50000 UNIT) PO CAPS: 50000.0000 [IU] | ORAL_CAPSULE | ORAL | 0 refills | Status: DC

## 2021-02-14 NOTE — Progress Notes (Signed)
Chief Complaint:   OBESITY Adam Adams is here to discuss his progress with his obesity treatment plan along with follow-up of his obesity related diagnoses. Adam Adams is on keeping a food journal and adhering to recommended goals of 1800-2000 calories and 120 grams of protein and states he is following his eating plan approximately 70% of the time. Adam Adams states he is not been exercising.  Today's visit was #: 19 Starting weight: 203 lbs Starting date: 03/28/2020 Today's weight: 212 lbs Today's date: 02/13/2021 Total lbs lost to date: 0 Total lbs lost since last in-office visit: 1 lb  Interim History: Adam Adams continues to follow Hybrid of Intermittent Fasting (only eating 6 hrs/day) Monday- Saturday and will occasionally track intakes 3-4 days per week. When he tracks, he will typically consume 2000-2500 cal/day and he estimates to intake 90 grams protein/day.  Subjective:   1. Vitamin D deficiency Adam Adams's Vitamin D level was 44.7 on 01/02/21, which is below the goal of 50. He is currently taking prescription vitamin D 50,000 IU each week. He denies nausea, vomiting or muscle weakness.   2. Pre-diabetes Adam Adams has a diagnosis of prediabetes based on his elevated HgA1c and was informed this puts him at greater risk of developing diabetes. He continues to work on diet and exercise to decrease his risk of diabetes. He denies nausea or hypoglycemia. Patients's A1C is 5.5 (01/02/21 lab) and is at goal.  Adam Adams has stopped taking Metformin 500 mg QD for over 2 weeks.  Lab Results  Component Value Date   HGBA1C 5.5 01/02/2021   Lab Results  Component Value Date   INSULIN 12.5 01/02/2021   INSULIN 19.5 10/26/2020   INSULIN 19.1 07/04/2020   INSULIN 28.7 (H) 03/28/2020    3. At risk for diabetes mellitus Adam Adams is at higher than average risk for developing diabetes due to obesity due to pre-diabetes and being off of Metformin  Assessment/Plan:   1. Vitamin D deficiency Low Vitamin D level  contributes to fatigue and are associated with obesity, breast, and colon cancer. He agrees to continue to take prescription Vitamin D @50 ,000 IU every week and will follow-up for routine testing of Vitamin D, at least 2-3 times per year to avoid over-replacement.   - Vitamin D, Ergocalciferol, (DRISDOL) 1.25 MG (50000 UNIT) CAPS capsule; Take 1 capsule (50,000 Units total) by mouth every 7 (seven) days.  Dispense: 4 capsule; Refill: 0  2. Pre-diabetes Adam Adams will continue to work on weight loss, exercise, and decreasing simple carbohydrates to help decrease the risk of diabetes.   Discontinue Metformin. Adam Adams is to increase protein and limit carbohydrates and sugar.  3. At risk for diabetes mellitus Adam Adams was given approximately 15 minutes of diabetes education and counseling today. We discussed intensive lifestyle modifications today with an emphasis on weight loss as well as increasing exercise and decreasing simple carbohydrates in his diet. We also reviewed medication options with an emphasis on risk versus benefit of those discussed.   Repetitive spaced learning was employed today to elicit superior memory formation and behavioral change.   4. Current BMI 32.3 Adam Adams is currently in the action stage of change. As such, his goal is to continue with weight loss efforts. He has agreed to keeping a food journal and adhering to recommended goals of 1800-2000 calories and 120 grams of protein.   Exercise goals: As is  Behavioral modification strategies: increasing lean protein intake, decreasing simple carbohydrates, meal planning and cooking strategies, keeping healthy foods in the home,  planning for success, and keeping a strict food journal.  Adam Adams has agreed to follow-up with our clinic in 4 weeks. He was informed of the importance of frequent follow-up visits to maximize his success with intensive lifestyle modifications for his multiple health conditions.   Objective:   Blood pressure  109/69, pulse 62, temperature 97.6 F (36.4 C), height 5\' 8"  (1.727 m), weight 212 lb (96.2 kg), SpO2 99 %. Body mass index is 32.23 kg/m.  General: Cooperative, alert, well developed, in no acute distress. HEENT: Conjunctivae and lids unremarkable. Cardiovascular: Regular rhythm.  Lungs: Normal work of breathing. Neurologic: No focal deficits.   Lab Results  Component Value Date   CREATININE 0.94 01/02/2021   BUN 13 01/02/2021   NA 140 01/02/2021   K 4.2 01/02/2021   CL 102 01/02/2021   CO2 24 01/02/2021   Lab Results  Component Value Date   ALT 62 (H) 01/02/2021   AST 37 01/02/2021   ALKPHOS 56 01/02/2021   BILITOT 0.5 01/02/2021   Lab Results  Component Value Date   HGBA1C 5.5 01/02/2021   HGBA1C 5.6 10/26/2020   HGBA1C 5.3 07/04/2020   HGBA1C 5.7 (H) 03/28/2020   HGBA1C 5.5 12/09/2017   Lab Results  Component Value Date   INSULIN 12.5 01/02/2021   INSULIN 19.5 10/26/2020   INSULIN 19.1 07/04/2020   INSULIN 28.7 (H) 03/28/2020   Lab Results  Component Value Date   TSH 0.78 06/15/2020   Lab Results  Component Value Date   CHOL 160 01/02/2021   HDL 51 01/02/2021   LDLCALC 96 01/02/2021   TRIG 68 01/02/2021   CHOLHDL 3.1 01/02/2021   Lab Results  Component Value Date   WBC 6.2 06/15/2020   HGB 15.6 06/15/2020   HCT 45.4 06/15/2020   MCV 93.0 06/15/2020   PLT 351 06/15/2020   No results found for: IRON, TIBC, FERRITIN  Attestation Statements:   Reviewed by clinician on day of visit: allergies, medications, problem list, medical history, surgical history, family history, social history, and previous encounter notes.  I10/16/2021, CMA, am acting as Paulla Fore for Energy manager. Brianny Soulliere, NP.  I have reviewed the above documentation for accuracy and completeness, and I agree with the above. -  Isla Sabree d. Rayen Dafoe, NP-C

## 2021-02-22 ENCOUNTER — Other Ambulatory Visit: Payer: Self-pay

## 2021-02-22 ENCOUNTER — Ambulatory Visit (INDEPENDENT_AMBULATORY_CARE_PROVIDER_SITE_OTHER)

## 2021-02-22 ENCOUNTER — Ambulatory Visit (INDEPENDENT_AMBULATORY_CARE_PROVIDER_SITE_OTHER): Admitting: Sports Medicine

## 2021-02-22 DIAGNOSIS — S99921A Unspecified injury of right foot, initial encounter: Secondary | ICD-10-CM

## 2021-02-22 MED ORDER — MELOXICAM 15 MG PO TABS: ORAL_TABLET | ORAL | 3 refills | Status: DC

## 2021-02-22 NOTE — Assessment & Plan Note (Signed)
6 weeks ago this pleasant 30 year old male dropped a heavy object on the dorsum of his right tarsometatarsal joint, he had immediate pain, swelling, bruising, he never presented for medical care, today 6 weeks later he continues to have discomfort localized at the TMT, he does have a visible and palpable fullness with TMT bossing. Pain with articulation of the first metatarsal and the medial cuneiform. Will get x-rays, adding meloxicam, postop shoe. He will do seated duty at work. Return to see me in 4 weeks.

## 2021-02-22 NOTE — Progress Notes (Signed)
    Procedures performed today:    None.  Independent interpretation of notes and tests performed by another provider:   None.  Brief History, Exam, Impression, and Recommendations:    Foot trauma, right, initial encounter 6 weeks ago this pleasant 30 year old male dropped a heavy object on the dorsum of his right tarsometatarsal joint, he had immediate pain, swelling, bruising, he never presented for medical care, today 6 weeks later he continues to have discomfort localized at the TMT, he does have a visible and palpable fullness with TMT bossing. Pain with articulation of the first metatarsal and the medial cuneiform. Will get x-rays, adding meloxicam, postop shoe. He will do seated duty at work. Return to see me in 4 weeks.    ___________________________________________ [name]. Benjamin Stain, M.D., ABFM., CAQSM. Primary Care and Sports Medicine [facility]  Adjunct Instructor of Family Medicine  [employer]

## 2021-03-14 ENCOUNTER — Ambulatory Visit (INDEPENDENT_AMBULATORY_CARE_PROVIDER_SITE_OTHER): Payer: Managed Care, Other (non HMO) | Admitting: Adult Health

## 2021-03-14 ENCOUNTER — Other Ambulatory Visit: Payer: Self-pay

## 2021-03-14 ENCOUNTER — Encounter (INDEPENDENT_AMBULATORY_CARE_PROVIDER_SITE_OTHER): Payer: Self-pay | Admitting: Adult Health

## 2021-03-14 VITALS — BP 114/76 | HR 71 | Temp 98.2°F | Ht 68.0 in | Wt 215.0 lb

## 2021-03-14 DIAGNOSIS — Z6830 Body mass index (BMI) 30.0-30.9, adult: Secondary | ICD-10-CM | POA: Diagnosis not present

## 2021-03-14 DIAGNOSIS — E559 Vitamin D deficiency, unspecified: Secondary | ICD-10-CM | POA: Diagnosis not present

## 2021-03-14 DIAGNOSIS — Z9189 Other specified personal risk factors, not elsewhere classified: Secondary | ICD-10-CM

## 2021-03-14 DIAGNOSIS — E8881 Metabolic syndrome: Secondary | ICD-10-CM

## 2021-03-14 DIAGNOSIS — E669 Obesity, unspecified: Secondary | ICD-10-CM

## 2021-03-14 MED ORDER — VITAMIN D (ERGOCALCIFEROL) 1.25 MG (50000 UNIT) PO CAPS: 50000.0000 [IU] | ORAL_CAPSULE | ORAL | 0 refills | Status: DC

## 2021-03-20 NOTE — Progress Notes (Signed)
Chief Complaint:   OBESITY Adam Adams is here to discuss his progress with his obesity treatment plan along with follow-up of his obesity related diagnoses. Adam Adams is on keeping a food journal and adhering to recommended goals of 1800-2000 calories and 120 g protein and states he is following his eating plan approximately 75% of the time. Adam Adams states he is walking, lifting, and renovating house 3-4 hours 7 times per week.  Today's visit was #: 20 Starting weight: 203 lbs Starting date: 03/28/2020 Today's weight: 215 lbs Today's date: 03/14/2021 Total lbs lost to date: 0 Total lbs lost since last in-office visit: 0  Interim History: Adam Adams' wife is [redacted] weeks pregnant- he feels "prepared" for his daughter's impending arrival.  He is focused on tracking cal/protein intake, consuming all food within 8 hour period.  He has stopped full day fasting. He reports increased hunger levels late evening.  He estimates to consume 90 grams protein/day.  Subjective:   1. Vitamin D deficiency 01/03/20 Vit D level was 44.7 - just below goal of 50. He is currently taking prescription vitamin D 50,000 IU each week. He denies nausea, vomiting or muscle weakness.   2. Insulin resistance Adam Adams has been on/off Metformin and prefers to remain off.  Lab Results  Component Value Date   INSULIN 12.5 01/02/2021   INSULIN 19.5 10/26/2020   INSULIN 19.1 07/04/2020   INSULIN 28.7 (H) 03/28/2020   Lab Results  Component Value Date   HGBA1C 5.5 01/02/2021   3. At risk for deficient intake of food Adam Adams is at risk for deficient intake of food due to deficient protein intake.  Assessment/Plan:   1. Vitamin D deficiency Low Vitamin D level contributes to fatigue and are associated with obesity, breast, and colon cancer. He agrees to continue to take prescription Vitamin D @50 ,000 IU every week and will follow-up for routine testing of Vitamin D, at least 2-3 times per year to avoid over-replacement.  Refill-  Vitamin D, Ergocalciferol, (DRISDOL) 1.25 MG (50000 UNIT) CAPS capsule; Take 1 capsule (50,000 Units total) by mouth every 7 (seven) days.  Dispense: 4 capsule; Refill: 0  2. Insulin resistance Adam Adams will continue to work on weight loss, exercise, increase protein, and decreasing simple carbohydrates to help decrease the risk of diabetes. Adam Adams agreed to follow-up with as directed to closely monitor his progress.  3. At risk for deficient intake of food Adam Adams was given approximately 15 minutes of deficit intake of food prevention counseling today. Hawke is at risk for eating too few calories based on current food recall. He was encouraged to focus on meeting caloric and protein goals according to his recommended meal plan.    4. Current BMI 32.7  Adam Adams is currently in the action stage of change. As such, his goal is to continue with weight loss efforts. He has agreed to keeping a food journal and adhering to recommended goals of 1800-2000 calories and 120 g protein.   Focus on higher protein snacks to better fuel self and decreased hunger levels. Exercise goals:  As is  Behavioral modification strategies: increasing lean protein intake, decreasing simple carbohydrates, no skipping meals, meal planning and cooking strategies, keeping healthy foods in the home, better snacking choices, planning for success, and keeping a strict food journal.  Adam Adams has agreed to follow-up with our clinic in 5 weeks. He was informed of the importance of frequent follow-up visits to maximize his success with intensive lifestyle modifications for his multiple health conditions.  Objective:   Blood pressure 114/76, pulse 71, temperature 98.2 F (36.8 C), height 5\' 8"  (1.727 m), weight 215 lb (97.5 kg), SpO2 98 %. Body mass index is 32.69 kg/m.  General: Cooperative, alert, well developed, in no acute distress. HEENT: Conjunctivae and lids unremarkable. Cardiovascular: Regular rhythm.  Lungs: Normal work of  breathing. Neurologic: No focal deficits.   Lab Results  Component Value Date   CREATININE 0.94 01/02/2021   BUN 13 01/02/2021   NA 140 01/02/2021   K 4.2 01/02/2021   CL 102 01/02/2021   CO2 24 01/02/2021   Lab Results  Component Value Date   ALT 62 (H) 01/02/2021   AST 37 01/02/2021   ALKPHOS 56 01/02/2021   BILITOT 0.5 01/02/2021   Lab Results  Component Value Date   HGBA1C 5.5 01/02/2021   HGBA1C 5.6 10/26/2020   HGBA1C 5.3 07/04/2020   HGBA1C 5.7 (H) 03/28/2020   HGBA1C 5.5 12/09/2017   Lab Results  Component Value Date   INSULIN 12.5 01/02/2021   INSULIN 19.5 10/26/2020   INSULIN 19.1 07/04/2020   INSULIN 28.7 (H) 03/28/2020   Lab Results  Component Value Date   TSH 0.78 06/15/2020   Lab Results  Component Value Date   CHOL 160 01/02/2021   HDL 51 01/02/2021   LDLCALC 96 01/02/2021   TRIG 68 01/02/2021   CHOLHDL 3.1 01/02/2021   Lab Results  Component Value Date   VD25OH 44.7 01/02/2021   VD25OH 32.3 10/26/2020   VD25OH 42.0 07/04/2020   Lab Results  Component Value Date   WBC 6.2 06/15/2020   HGB 15.6 06/15/2020   HCT 45.4 06/15/2020   MCV 93.0 06/15/2020   PLT 351 06/15/2020    Attestation Statements:   Reviewed by clinician on day of visit: allergies, medications, problem list, medical history, surgical history, family history, social history, and previous encounter notes.  06/17/2020, CMA, am acting as transcriptionist for Edmund Hilda, NP.  I have reviewed the above documentation for accuracy and completeness, and I agree with the above. -  Annalucia Laino d. Ugo Thoma, NP-C

## 2021-03-22 ENCOUNTER — Ambulatory Visit: Admitting: Sports Medicine

## 2021-04-17 ENCOUNTER — Ambulatory Visit (INDEPENDENT_AMBULATORY_CARE_PROVIDER_SITE_OTHER): Admitting: Adult Health

## 2021-05-23 ENCOUNTER — Encounter (INDEPENDENT_AMBULATORY_CARE_PROVIDER_SITE_OTHER): Payer: Self-pay | Admitting: Adult Health

## 2021-05-23 ENCOUNTER — Ambulatory Visit (INDEPENDENT_AMBULATORY_CARE_PROVIDER_SITE_OTHER): Payer: Managed Care, Other (non HMO) | Admitting: Adult Health

## 2021-05-23 ENCOUNTER — Other Ambulatory Visit: Payer: Self-pay

## 2021-05-23 VITALS — BP 122/74 | HR 83 | Temp 97.6°F | Ht 68.0 in | Wt 217.0 lb

## 2021-05-23 DIAGNOSIS — R1011 Right upper quadrant pain: Secondary | ICD-10-CM | POA: Insufficient documentation

## 2021-05-23 DIAGNOSIS — E559 Vitamin D deficiency, unspecified: Secondary | ICD-10-CM

## 2021-05-23 DIAGNOSIS — Z9189 Other specified personal risk factors, not elsewhere classified: Secondary | ICD-10-CM

## 2021-05-23 DIAGNOSIS — E8881 Metabolic syndrome: Secondary | ICD-10-CM | POA: Diagnosis not present

## 2021-05-23 DIAGNOSIS — K8050 Calculus of bile duct without cholangitis or cholecystitis without obstruction: Secondary | ICD-10-CM | POA: Insufficient documentation

## 2021-05-23 DIAGNOSIS — E669 Obesity, unspecified: Secondary | ICD-10-CM

## 2021-05-23 DIAGNOSIS — Z6830 Body mass index (BMI) 30.0-30.9, adult: Secondary | ICD-10-CM

## 2021-05-23 NOTE — Progress Notes (Signed)
Chief Complaint:   OBESITY Adam Adams is here to discuss his progress with his obesity treatment plan along with follow-up of his obesity related diagnoses. Trust is on keeping a food journal and adhering to recommended goals of 1800-2000 calories and 120 grams of protein and states he is following his eating plan approximately 40% of the time. Langston states he is not exercising regularly.  Today's visit was #: 21 Starting weight: 203 lbs Starting date: 03/28/2020 Today's weight: 217 lbs Today's date: 05/23/2021 Total lbs lost to date: 0 Total lbs lost since last in-office visit: 0  Interim History: Graycen and his wife welcomed Sophie 6 weeks ago! He has experienced increased polyphagia and has been eating outside the 16/8 intermittent fasting (IF) window. He feels that he and his wife are adjusting well with the expansion of their family.  He would like to resume IF 16/8, decreasing CHO intake, and re-start regular exercise (he just joined an adult soccer team).  Subjective:   1. Vitamin D deficiency Vitamin D level on 01/02/2021 - 44.7. He is currently taking prescription ergocalciferol 50,000 IU each week. He denies nausea, vomiting or muscle weakness.  Lab Results  Component Value Date   VD25OH 44.7 01/02/2021   VD25OH 32.3 10/26/2020   VD25OH 42.0 07/04/2020   2. RUQ pain Intermittent RUQ. Brief pain rated 7/10 -  described as sharp/shooting with radiation to right upper anterior chest.  He estimates the pain will occur 3 times a week, duration a few seconds.  When it occurs, it will happen all in one day.  He denies nausea with pain.   He is unsure if the pain correlates with eating. He has all his original GI organs. He denies known faily hx of cholelithiasis.  3. Insulin resistance He was briefly on metformin - unable to remember to consistently take prescription everyday. He did experience decreased polyphagia when using the biguanide.  Lab Results  Component Value  Date   INSULIN 12.5 01/02/2021   INSULIN 19.5 10/26/2020   INSULIN 19.1 07/04/2020   INSULIN 28.7 (H) 03/28/2020   Lab Results  Component Value Date   HGBA1C 5.5 01/02/2021   4. At risk for impaired function of liver Kallum is at risk for impaired function of liver due to hepatic steatosis and obesity.  Assessment/Plan:   1. Vitamin D deficiency Check labs today. Refill ergocalciferol after labs.  - VITAMIN D 25 Hydroxy (Vit-D Deficiency, Fractures)  2. RUQ pain Check labs today.  - Comprehensive metabolic panel  -Consider repeating abdominal US if pain persists/worsens.  3. Insulin resistance Check labs today.  - Hemoglobin A1c - Insulin, random  4. At risk for impaired function of liver Leng was given approximately 15 minutes of counseling today regarding prevention of impaired liver function. Jourdain was educated about his risk of developing NASH or even liver failure and advised that the only proven treatment for NAFLD was weight loss of at least 5-10% of body weight.    5. Current BMI 33.1  Louie is currently in the action stage of change. As such, his goal is to continue with weight loss efforts. He has agreed to IF with journaling - keeping a food journal and adhering to recommended goals of 1800-2000 calories and 120 grams of protein.   Exercise goals:  As is.  Behavioral modification strategies: increasing lean protein intake, decreasing simple carbohydrates, meal planning and cooking strategies, keeping healthy foods in the home, and planning for success.  Terrie has agreed  to follow-up with our clinic in 4 weeks. He was informed of the importance of frequent follow-up visits to maximize his success with intensive lifestyle modifications for his multiple health conditions.   Azhar was informed we would discuss his lab results at his next visit unless there is a critical issue that needs to be addressed sooner. Randeep agreed to keep his next visit at the agreed  upon time to discuss these results.  Objective:   Blood pressure 122/74, pulse 83, temperature 97.6 F (36.4 C), height 5\' 8"  (1.727 m), weight 217 lb (98.4 kg), SpO2 100 %. Body mass index is 32.99 kg/m.  General: Cooperative, alert, well developed, in no acute distress. HEENT: Conjunctivae and lids unremarkable. Cardiovascular: Regular rhythm.  Lungs: Normal work of breathing. Neurologic: No focal deficits.   Lab Results  Component Value Date   CREATININE 0.94 01/02/2021   BUN 13 01/02/2021   NA 140 01/02/2021   K 4.2 01/02/2021   CL 102 01/02/2021   CO2 24 01/02/2021   Lab Results  Component Value Date   ALT 62 (H) 01/02/2021   AST 37 01/02/2021   ALKPHOS 56 01/02/2021   BILITOT 0.5 01/02/2021   Lab Results  Component Value Date   HGBA1C 5.5 01/02/2021   HGBA1C 5.6 10/26/2020   HGBA1C 5.3 07/04/2020   HGBA1C 5.7 (H) 03/28/2020   HGBA1C 5.5 12/09/2017   Lab Results  Component Value Date   INSULIN 12.5 01/02/2021   INSULIN 19.5 10/26/2020   INSULIN 19.1 07/04/2020   INSULIN 28.7 (H) 03/28/2020   Lab Results  Component Value Date   TSH 0.78 06/15/2020   Lab Results  Component Value Date   CHOL 160 01/02/2021   HDL 51 01/02/2021   LDLCALC 96 01/02/2021   TRIG 68 01/02/2021   CHOLHDL 3.1 01/02/2021   Lab Results  Component Value Date   VD25OH 44.7 01/02/2021   VD25OH 32.3 10/26/2020   VD25OH 42.0 07/04/2020   Lab Results  Component Value Date   WBC 6.2 06/15/2020   HGB 15.6 06/15/2020   HCT 45.4 06/15/2020   MCV 93.0 06/15/2020   PLT 351 06/15/2020   Attestation Statements:   Reviewed by clinician on day of visit: allergies, medications, problem list, medical history, surgical history, family history, social history, and previous encounter notes.  I, 06/17/2020, CMA, am acting as Insurance claims handler for Energy manager, NP.  I have reviewed the above documentation for accuracy and completeness, and I agree with the above. -  Paytin Ramakrishnan d. Zoelle Markus,  NP-C

## 2021-05-24 ENCOUNTER — Encounter (INDEPENDENT_AMBULATORY_CARE_PROVIDER_SITE_OTHER): Payer: Self-pay | Admitting: Adult Health

## 2021-05-24 ENCOUNTER — Other Ambulatory Visit (INDEPENDENT_AMBULATORY_CARE_PROVIDER_SITE_OTHER): Payer: Self-pay | Admitting: Adult Health

## 2021-05-24 DIAGNOSIS — E559 Vitamin D deficiency, unspecified: Secondary | ICD-10-CM

## 2021-05-24 LAB — COMPREHENSIVE METABOLIC PANEL
ALT: 50 IU/L — ABNORMAL HIGH (ref 0–44)
AST: 27 IU/L (ref 0–40)
Albumin/Globulin Ratio: 2 (ref 1.2–2.2)
Albumin: 4.7 g/dL (ref 4.0–5.0)
Alkaline Phosphatase: 66 IU/L (ref 44–121)
BUN/Creatinine Ratio: 25 — ABNORMAL HIGH (ref 9–20)
BUN: 18 mg/dL (ref 6–20)
Bilirubin Total: 0.4 mg/dL (ref 0.0–1.2)
CO2: 23 mmol/L (ref 20–29)
Calcium: 9.4 mg/dL (ref 8.7–10.2)
Chloride: 105 mmol/L (ref 96–106)
Creatinine, Ser: 0.73 mg/dL — ABNORMAL LOW (ref 0.76–1.27)
Globulin, Total: 2.4 g/dL (ref 1.5–4.5)
Glucose: 98 mg/dL (ref 65–99)
Potassium: 4.7 mmol/L (ref 3.5–5.2)
Sodium: 142 mmol/L (ref 134–144)
Total Protein: 7.1 g/dL (ref 6.0–8.5)
eGFR: 125 mL/min/{1.73_m2} (ref >59)

## 2021-05-24 LAB — HEMOGLOBIN A1C
Est. average glucose Bld gHb Est-mCnc: 117 mg/dL
Hgb A1c MFr Bld: 5.7 % — ABNORMAL HIGH (ref 4.8–5.6)

## 2021-05-24 LAB — INSULIN, RANDOM: INSULIN: 28.9 u[IU]/mL — ABNORMAL HIGH (ref 2.6–24.9)

## 2021-05-24 LAB — VITAMIN D 25 HYDROXY (VIT D DEFICIENCY, FRACTURES): Vit D, 25-Hydroxy: 39.2 ng/mL (ref 30.0–100.0)

## 2021-05-24 MED ORDER — VITAMIN D (ERGOCALCIFEROL) 1.25 MG (50000 UNIT) PO CAPS: 50000.0000 [IU] | ORAL_CAPSULE | ORAL | 0 refills | Status: DC

## 2021-06-21 ENCOUNTER — Other Ambulatory Visit: Payer: Self-pay

## 2021-06-21 ENCOUNTER — Encounter (INDEPENDENT_AMBULATORY_CARE_PROVIDER_SITE_OTHER): Payer: Self-pay | Admitting: Adult Health

## 2021-06-21 ENCOUNTER — Ambulatory Visit (INDEPENDENT_AMBULATORY_CARE_PROVIDER_SITE_OTHER): Payer: Managed Care, Other (non HMO) | Admitting: Adult Health

## 2021-06-21 VITALS — BP 116/77 | HR 68 | Temp 97.4°F | Ht 68.0 in | Wt 221.0 lb

## 2021-06-21 DIAGNOSIS — R7303 Prediabetes: Secondary | ICD-10-CM

## 2021-06-21 DIAGNOSIS — E559 Vitamin D deficiency, unspecified: Secondary | ICD-10-CM | POA: Diagnosis not present

## 2021-06-21 DIAGNOSIS — R7401 Elevation of levels of liver transaminase levels: Secondary | ICD-10-CM

## 2021-06-21 DIAGNOSIS — Z9189 Other specified personal risk factors, not elsewhere classified: Secondary | ICD-10-CM

## 2021-06-21 DIAGNOSIS — E669 Obesity, unspecified: Secondary | ICD-10-CM

## 2021-06-21 DIAGNOSIS — Z6830 Body mass index (BMI) 30.0-30.9, adult: Secondary | ICD-10-CM

## 2021-06-21 MED ORDER — TIRZEPATIDE 2.5 MG/0.5ML ~~LOC~~ SOAJ: 2.5000 mg | SUBCUTANEOUS | 0 refills | Status: DC

## 2021-06-21 MED ORDER — VITAMIN D (ERGOCALCIFEROL) 1.25 MG (50000 UNIT) PO CAPS: 50000.0000 [IU] | ORAL_CAPSULE | ORAL | 0 refills | Status: DC

## 2021-06-21 NOTE — Progress Notes (Signed)
Chief Complaint:   OBESITY Adam Adams is here to discuss his progress with his obesity treatment plan along with follow-up of his obesity related diagnoses. Adam Adams is on keeping a food journal and adhering to recommended goals of 1800-2000 calories and 120 grams of protein and states he is following his eating plan approximately 0% of the time. Adam Adams states he is running for 45 minutes 3-4 times per week.  Today's visit was #: 22 Starting weight: 203 lbs Starting date: 03/28/2020 Today's weight: 221 lbs Today's date: 06/21/2021 Total lbs lost to date: 0 Total lbs lost since last in-office visit: 0  Interim History: Adam Adams has not been following any prescribed eating plan since last OV. Typical day of consumption: Breakfast - oatmeal Lunch - whatever is available - [employer], [employer] Dinner - [employer] (protein rich)  Infant daughter is 2.5 months old  He reports significant increase in polyphagia throughout the entire day. He is unable to fast past 1000.  Subjective:   1. Vitamin D deficiency Discussed labs with patient today. Vitamin D level on 05/23/2021 - 39.2 - below goal of 50. He is currently taking prescription ergocalciferol 50,000 IU each week. He denies nausea, vomiting or muscle weakness.  2. Transaminitis Discussed labs with patient today.  He denies abdominal pain.  On 05/23/2021, CMP - ALT 50 - improved, yet still above goal.  3. Pre-diabetes Worsening.  Discussed labs with patient today.  He reports a significant increase in polyphagia the last few weeks. He cannot fast past 1000 On 05/23/2021, BG 98, A1c 5.7 (elevated), insulin 28.9 (elevated). He feels that his family hx of significant prediabetes, however, he denies know family hx of T2D. He denies history of MTC or personal history of pancreatitis.  4. At risk for nausea Ihan is at risk for nausea due to GIP/GLP-1 for worsening IR/prediabetes.  Assessment/Plan:   1. Vitamin D deficiency Refill  ergocalciferol 50,000 IU once weekly, dispense 4 capsules, 0 refills.  2. Transaminitis Avoid overuse of acetaminophen.   3. Pre-diabetes Start Mounjaro 2.5 mg once weekly, as per below.  - Start tirzepatide [facility]) 2.5 MG/0.5ML Pen; Inject 2.5 mg into the skin once a week.  Dispense: 2 mL; Refill: 0  4. At risk for nausea Adam Adams was given approximately 15 minutes of nausea prevention counseling today. Anjel is at risk for nausea due to his new or current medication. He was encouraged to titrate his medication slowly, make sure to stay hydrated, eat smaller portions throughout the day, and avoid high fat meals.   5. Current BMI 33.7  Dannon is currently in the action stage of change. As such, his goal is to continue with weight loss efforts. He has agreed to keeping a food journal and adhering to recommended goals of 1800-2000 calories and 120 grams of protein.   Exercise goals:  As is.  Behavioral modification strategies: increasing lean protein intake, decreasing simple carbohydrates, keeping healthy foods in the home, ways to avoid boredom eating, better snacking choices, and planning for success.  Wadie has agreed to follow-up with our clinic in 2-3 weeks. He was informed of the importance of frequent follow-up visits to maximize his success with intensive lifestyle modifications for his multiple health conditions.   Objective:   Blood pressure 116/77, pulse 68, temperature (!) 97.4 F (36.3 C), height 5\' 8"  (1.727 m), weight 221 lb (100.2 kg), SpO2 99 %. Body mass index is 33.6 kg/m.  General: Cooperative, alert, well developed, in no acute distress.  HEENT: Conjunctivae and lids unremarkable. Cardiovascular: Regular rhythm.  Lungs: Normal work of breathing. Neurologic: No focal deficits.   Lab Results  Component Value Date   CREATININE 0.73 (L) 05/23/2021   BUN 18 05/23/2021   NA 142 05/23/2021   K 4.7 05/23/2021   CL 105 05/23/2021   CO2 23 05/23/2021   Lab  Results  Component Value Date   ALT 50 (H) 05/23/2021   AST 27 05/23/2021   ALKPHOS 66 05/23/2021   BILITOT 0.4 05/23/2021   Lab Results  Component Value Date   HGBA1C 5.7 (H) 05/23/2021   HGBA1C 5.5 01/02/2021   HGBA1C 5.6 10/26/2020   HGBA1C 5.3 07/04/2020   HGBA1C 5.7 (H) 03/28/2020   Lab Results  Component Value Date   INSULIN 28.9 (H) 05/23/2021   INSULIN 12.5 01/02/2021   INSULIN 19.5 10/26/2020   INSULIN 19.1 07/04/2020   INSULIN 28.7 (H) 03/28/2020   Lab Results  Component Value Date   TSH 0.78 06/15/2020   Lab Results  Component Value Date   CHOL 160 01/02/2021   HDL 51 01/02/2021   LDLCALC 96 01/02/2021   TRIG 68 01/02/2021   CHOLHDL 3.1 01/02/2021   Lab Results  Component Value Date   VD25OH 39.2 05/23/2021   VD25OH 44.7 01/02/2021   VD25OH 32.3 10/26/2020   Lab Results  Component Value Date   WBC 6.2 06/15/2020   HGB 15.6 06/15/2020   HCT 45.4 06/15/2020   MCV 93.0 06/15/2020   PLT 351 06/15/2020   Attestation Statements:   Reviewed by clinician on day of visit: allergies, medications, problem list, medical history, surgical history, family history, social history, and previous encounter notes.  I, Insurance claims handler, CMA, am acting as Energy manager for William Hamburger, NP.  I have reviewed the above documentation for accuracy and completeness, and I agree with the above. -  Philomina Leon d. Copelan Maultsby, NP-C

## 2021-07-02 ENCOUNTER — Encounter (INDEPENDENT_AMBULATORY_CARE_PROVIDER_SITE_OTHER): Payer: Self-pay

## 2021-07-09 IMAGING — DX DG FOOT COMPLETE 3+V*R*
3 series · 3 of 3 positions shown · non-contrast
Comparison: Right foot radiograph dated 06/15/2020.

CLINICAL DATA: 29-year-old male with pain over the first TMT joint.

EXAM:
RIGHT FOOT COMPLETE - 3+ VIEW

[foot ap]
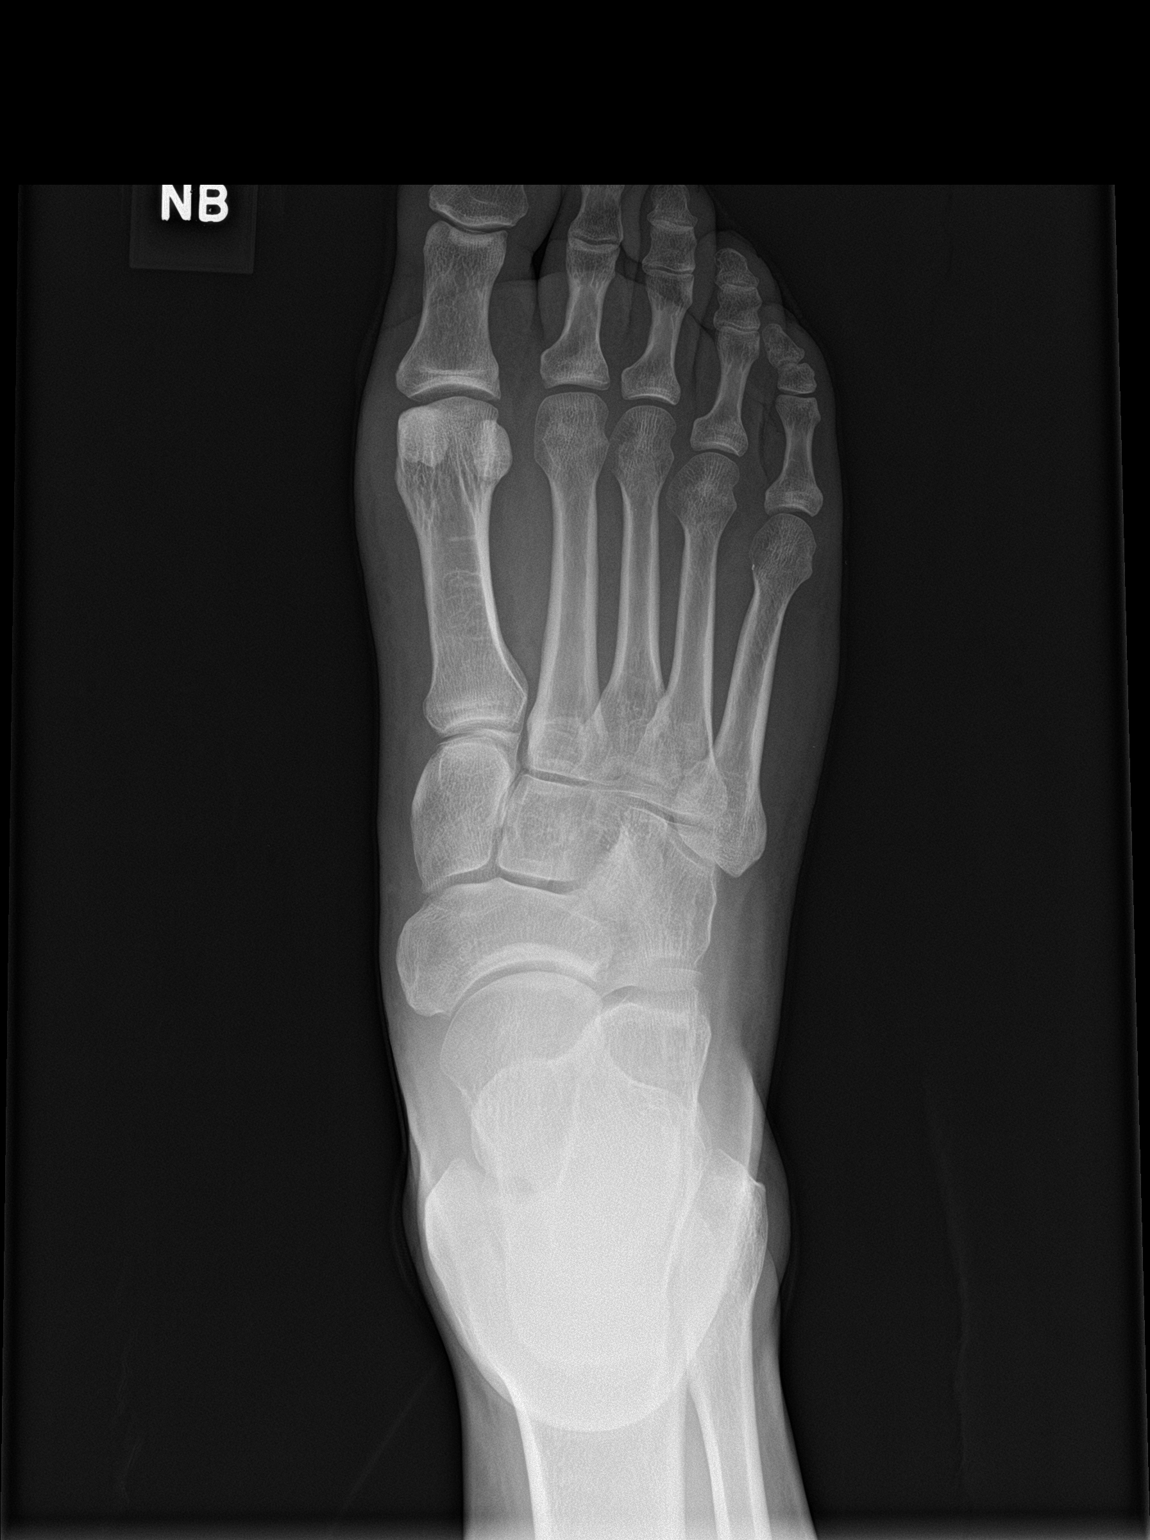

[foot obl]
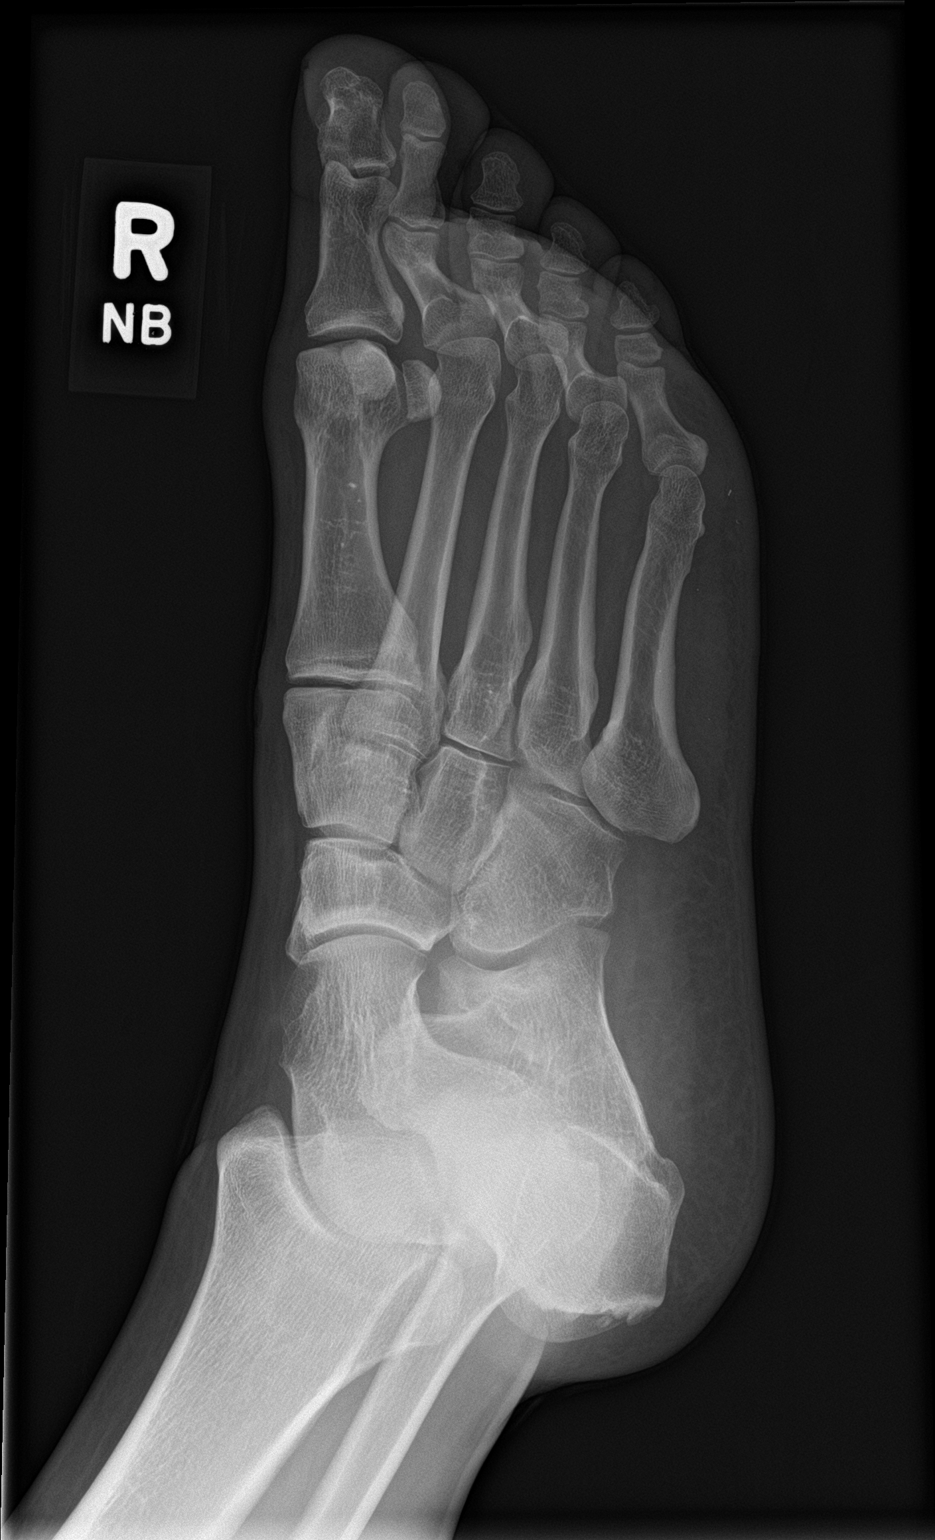

[foot lat]
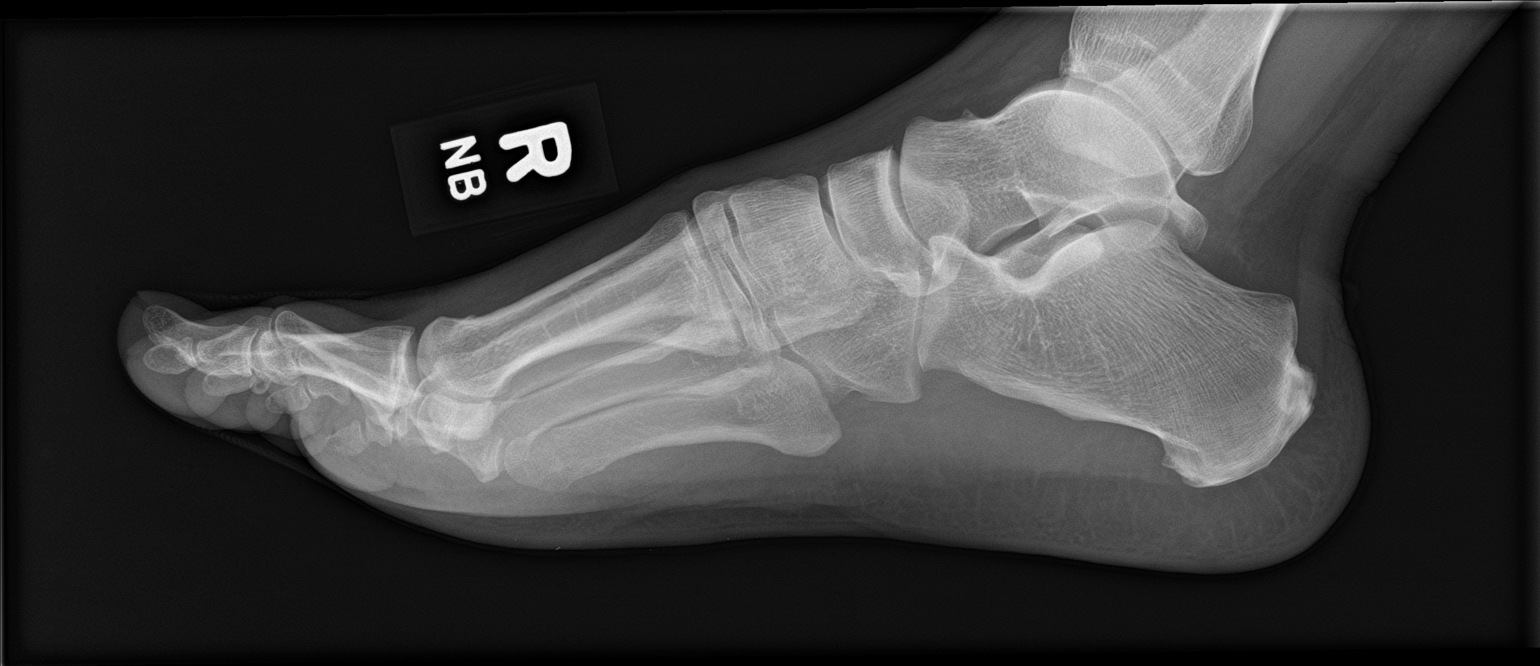

[3 of 3 positions shown; findings below may reference images not displayed]

FINDINGS: There is no acute fracture or dislocation. The bones are well
mineralized. No significant arthritic changes. The soft tissues are
unremarkable.
IMPRESSION: Negative.

## 2021-07-12 ENCOUNTER — Encounter (INDEPENDENT_AMBULATORY_CARE_PROVIDER_SITE_OTHER): Payer: Self-pay | Admitting: Adult Health

## 2021-07-12 ENCOUNTER — Ambulatory Visit (INDEPENDENT_AMBULATORY_CARE_PROVIDER_SITE_OTHER): Payer: Managed Care, Other (non HMO) | Admitting: Adult Health

## 2021-07-12 ENCOUNTER — Other Ambulatory Visit: Payer: Self-pay

## 2021-07-12 VITALS — BP 112/72 | HR 69 | Temp 98.2°F | Ht 68.0 in | Wt 212.0 lb

## 2021-07-12 DIAGNOSIS — E669 Obesity, unspecified: Secondary | ICD-10-CM | POA: Diagnosis not present

## 2021-07-12 DIAGNOSIS — R7303 Prediabetes: Secondary | ICD-10-CM

## 2021-07-12 DIAGNOSIS — Z9189 Other specified personal risk factors, not elsewhere classified: Secondary | ICD-10-CM

## 2021-07-12 DIAGNOSIS — Z6830 Body mass index (BMI) 30.0-30.9, adult: Secondary | ICD-10-CM

## 2021-07-12 NOTE — Progress Notes (Signed)
Chief Complaint:   OBESITY Adam Adams is here to discuss his progress with his obesity treatment plan along with follow-up of his obesity related diagnoses. Adam Adams is on keeping a food journal and adhering to recommended goals of 1800-2000 calories and 120 grams of protein and states he is following his eating plan approximately 0% of the time. Adam Adams states he is going to the gym for 60 minutes 3 times per week.  Today's visit was #: 23 Starting weight: 203 lbs Starting date: 03/28/2020 Today's weight: 212 lbs Today's date: 07/12/2021 Total lbs lost to date: 0 Total lbs lost since last in-office visit: 9 lbs  Interim History: Adam Adams was started on 06/21/2021 He injects on Wednesday. One the first Wednesday, he was only able to consume 500 calories. He slowly increased calories to 1200/day by that first weekend. With the second injection, he was able to tolerate food much better. He denies mass in neck, dysphagia, dyspepsia, persistent hoarseness, constipation, or nausea.  Subjective:   1. Pre-diabetes Adam Adams was started on 06/21/2021 He injects on Wednesday. One the first Wednesday, he was only able to consume 500 calories. He slowly increased calories to 1200/day by that first weekend. With the second injection, he was able to tolerate food much better. He denies mass in neck, dysphagia, dyspepsia, persistent hoarseness, constipation, or nausea.  2. At risk for constipation Adam Adams is at increased risk for constipation due to [facility], worsening IR, and prediabetes.  Assessment/Plan:   1. Pre-diabetes Refill Mounjaro 2.5 mg once weekly.  2. At risk for constipation Adam Adams was given approximately 15 minutes of counseling today regarding prevention of constipation. He was encouraged to increase water and fiber intake.    3. Current BMI 32.2  Adam Adams is currently in the action stage of change. As such, his goal is to continue with weight loss efforts. He has agreed to keeping  a food journal and adhering to recommended goals of 1800 calories and 100 grams of protein.   Eat small meals throughout the day, try to consume at least 1800 cal/100 g protein/day. Exercise goals:  As is.  Behavioral modification strategies: increasing lean protein intake, decreasing simple carbohydrates, meal planning and cooking strategies, keeping healthy foods in the home, and planning for success.  Adam Adams has agreed to follow-up with our clinic in 4 weeks. He was informed of the importance of frequent follow-up visits to maximize his success with intensive lifestyle modifications for his multiple health conditions.   Objective:   Blood pressure 112/72, pulse 69, temperature 98.2 F (36.8 C), height 5\' 8"  (1.727 m), weight 212 lb (96.2 kg), SpO2 99 %. Body mass index is 32.23 kg/m.  General: Cooperative, alert, well developed, in no acute distress. HEENT: Conjunctivae and lids unremarkable. Cardiovascular: Regular rhythm.  Lungs: Normal work of breathing. Neurologic: No focal deficits.   Lab Results  Component Value Date   CREATININE 0.73 (L) 05/23/2021   BUN 18 05/23/2021   NA 142 05/23/2021   K 4.7 05/23/2021   CL 105 05/23/2021   CO2 23 05/23/2021   Lab Results  Component Value Date   ALT 50 (H) 05/23/2021   AST 27 05/23/2021   ALKPHOS 66 05/23/2021   BILITOT 0.4 05/23/2021   Lab Results  Component Value Date   HGBA1C 5.7 (H) 05/23/2021   HGBA1C 5.5 01/02/2021   HGBA1C 5.6 10/26/2020   HGBA1C 5.3 07/04/2020   HGBA1C 5.7 (H) 03/28/2020   Lab Results  Component Value Date   INSULIN 28.9 (  H) 05/23/2021   INSULIN 12.5 01/02/2021   INSULIN 19.5 10/26/2020   INSULIN 19.1 07/04/2020   INSULIN 28.7 (H) 03/28/2020   Lab Results  Component Value Date   TSH 0.78 06/15/2020   Lab Results  Component Value Date   CHOL 160 01/02/2021   HDL 51 01/02/2021   LDLCALC 96 01/02/2021   TRIG 68 01/02/2021   CHOLHDL 3.1 01/02/2021   Lab Results  Component Value Date    VD25OH 39.2 05/23/2021   VD25OH 44.7 01/02/2021   VD25OH 32.3 10/26/2020   Lab Results  Component Value Date   WBC 6.2 06/15/2020   HGB 15.6 06/15/2020   HCT 45.4 06/15/2020   MCV 93.0 06/15/2020   PLT 351 06/15/2020   Attestation Statements:   Reviewed by clinician on day of visit: allergies, medications, problem list, medical history, surgical history, family history, social history, and previous encounter notes.  I, Insurance claims handler, CMA, am acting as Energy manager for William Hamburger, NP.  I have reviewed the above documentation for accuracy and completeness, and I agree with the above. -  Ustin Cruickshank d. Contrell Ballentine, NP-C

## 2021-08-08 ENCOUNTER — Ambulatory Visit (INDEPENDENT_AMBULATORY_CARE_PROVIDER_SITE_OTHER): Payer: Managed Care, Other (non HMO) | Admitting: Adult Health

## 2021-08-08 ENCOUNTER — Other Ambulatory Visit: Payer: Self-pay

## 2021-08-08 ENCOUNTER — Encounter (INDEPENDENT_AMBULATORY_CARE_PROVIDER_SITE_OTHER): Payer: Self-pay | Admitting: Adult Health

## 2021-08-08 VITALS — BP 114/68 | HR 57 | Temp 98.5°F | Ht 68.0 in | Wt 212.0 lb

## 2021-08-08 DIAGNOSIS — E669 Obesity, unspecified: Secondary | ICD-10-CM | POA: Diagnosis not present

## 2021-08-08 DIAGNOSIS — R7303 Prediabetes: Secondary | ICD-10-CM | POA: Diagnosis not present

## 2021-08-08 DIAGNOSIS — Z9189 Other specified personal risk factors, not elsewhere classified: Secondary | ICD-10-CM | POA: Diagnosis not present

## 2021-08-08 DIAGNOSIS — G8929 Other chronic pain: Secondary | ICD-10-CM

## 2021-08-08 DIAGNOSIS — Z6830 Body mass index (BMI) 30.0-30.9, adult: Secondary | ICD-10-CM

## 2021-08-08 DIAGNOSIS — M25512 Pain in left shoulder: Secondary | ICD-10-CM

## 2021-08-08 DIAGNOSIS — E559 Vitamin D deficiency, unspecified: Secondary | ICD-10-CM

## 2021-08-08 MED ORDER — TIRZEPATIDE 2.5 MG/0.5ML ~~LOC~~ SOAJ: 2.5000 mg | SUBCUTANEOUS | 0 refills | Status: DC

## 2021-08-08 MED ORDER — VITAMIN D (ERGOCALCIFEROL) 1.25 MG (50000 UNIT) PO CAPS: 50000.0000 [IU] | ORAL_CAPSULE | ORAL | 0 refills | Status: DC

## 2021-08-09 NOTE — Progress Notes (Addendum)
Chief Complaint:   OBESITY Adam Adams is here to discuss his progress with his obesity treatment plan along with follow-up of his obesity related diagnoses. Adam Adams is on keeping a food journal and adhering to recommended goals of 1800 calories and 100 grams of protein and states he is following his eating plan approximately 100% of the time. Adam Adams states he is doing calisthenics for 45 minutes 3 times per week.  Today's visit was #: 24 Starting weight: 203 lbs Starting date: 03/28/2020 Today's weight: 212 lbs Today's date: 08/08/2021 Total lbs lost to date: 0 Total lbs lost since last in-office visit: 0  Interim History:  Adam Adams was started on Mounjaro 2.5 mg on 06/21/2021, due to worsening prediabetes and polyphagia. He has had 4 doses at the 2.5 mg strength; last dose on July 25, 2021. He denies mass in neck, dysphagia, dyspepsia, persistent hoarseness, or GI upset. He reports significant reduction in cravings/polyphagia- re can fast well past 1300 now.  Subjective:   1. Pre-diabetes On 05/23/2021, A1c was 5.7 with elevated insulin level of 28.9. Adam Adams was started on Mounjaro 2.5 mg on 06/21/2021, due to worsening prediabetes and polyphagia. He has had 4 doses at the 2.5 mg strength; last dose on July 25, 2021. He denies mass in neck, dysphagia, dyspepsia, persistent hoarseness, or GI upset. He reports significant reduction in cravings/polyphagia- re can fast well past 1300 now.  2. Vitamin D deficiency On 05/23/2021, vitamin D level - 39.2. He is currently taking prescription ergocalciferol 50,000 IU each week. He denies nausea, vomiting or muscle weakness.  3. 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 He disclosed this injury and subsequent pain at second PH[facility] in January 2022 to [employer].   He is waiting for an appointment with the [facility] orthopedic provider.  4. At risk for activity intolerance Adam Adams is at risk  for exercise intolerance due to chronic left shoulder pain.  Assessment/Plan:   1. Pre-diabetes Refill Mounjaro 2.5 mg once weekly, as per below.  - Refill tirzepatide (MOUNJARO) 2.5 MG/0.5ML Pen; Inject 2.5 mg into the skin once a week.  Dispense: 2 mL; Refill: 0  2. Vitamin D deficiency Refill ergocalciferol 50,000 IU once weekly.  - Refill Vitamin D, Ergocalciferol, (DRISDOL) 1.25 MG (50000 UNIT) CAPS capsule; Take 1 capsule (50,000 Units total) by mouth every 7 (seven) days.  Dispense: 4 capsule; Refill: 0  3. Chronic left shoulder pain Activity as tolerated.  4. At risk for activity intolerance Adam Adams was given approximately 15 minutes of exercise intolerance counseling today. He is 30 y.o. male and has risk factors exercise intolerance including obesity. We discussed intensive lifestyle modifications today with an emphasis on specific weight loss instructions and strategies. Adam Adams will slowly increase activity as tolerated.  Repetitive spaced learning was employed today to elicit superior memory formation and behavioral change.   5. Current BMI 32.4  Adam Adams is currently in the action stage of change. As such, his goal is to continue with weight loss efforts. He has agreed to keeping a food journal and adhering to recommended goals of 1800 calories and 100 grams of protein.   Exercise goals:  Home calisthenics (push ups/sit ups).  Behavioral modification strategies: increasing lean protein intake, decreasing simple carbohydrates, meal planning and cooking strategies, keeping healthy foods in the home, and planning for success.  Adam Adams has agreed to follow-up with our clinic in 4 weeks, fasting. He was informed of the importance  of frequent follow-up visits to maximize his success with intensive lifestyle modifications for his multiple health conditions.   Objective:   Blood pressure 114/68, pulse (!) 57, temperature 98.5 F (36.9 C), height 5\' 8"  (1.727 m), weight 212 lb (96.2  kg), SpO2 99 %. Body mass index is 32.23 kg/m.  General: Cooperative, alert, well developed, in no acute distress. HEENT: Conjunctivae and lids unremarkable. Cardiovascular: Regular rhythm.  Lungs: Normal work of breathing. Neurologic: No focal deficits.   Lab Results  Component Value Date   CREATININE 0.73 (L) 05/23/2021   BUN 18 05/23/2021   NA 142 05/23/2021   K 4.7 05/23/2021   CL 105 05/23/2021   CO2 23 05/23/2021   Lab Results  Component Value Date   ALT 50 (H) 05/23/2021   AST 27 05/23/2021   ALKPHOS 66 05/23/2021   BILITOT 0.4 05/23/2021   Lab Results  Component Value Date   HGBA1C 5.7 (H) 05/23/2021   HGBA1C 5.5 01/02/2021   HGBA1C 5.6 10/26/2020   HGBA1C 5.3 07/04/2020   HGBA1C 5.7 (H) 03/28/2020   Lab Results  Component Value Date   INSULIN 28.9 (H) 05/23/2021   INSULIN 12.5 01/02/2021   INSULIN 19.5 10/26/2020   INSULIN 19.1 07/04/2020   INSULIN 28.7 (H) 03/28/2020   Lab Results  Component Value Date   TSH 0.78 06/15/2020   Lab Results  Component Value Date   CHOL 160 01/02/2021   HDL 51 01/02/2021   LDLCALC 96 01/02/2021   TRIG 68 01/02/2021   CHOLHDL 3.1 01/02/2021   Lab Results  Component Value Date   VD25OH 39.2 05/23/2021   VD25OH 44.7 01/02/2021   VD25OH 32.3 10/26/2020   Lab Results  Component Value Date   WBC 6.2 06/15/2020   HGB 15.6 06/15/2020   HCT 45.4 06/15/2020   MCV 93.0 06/15/2020   PLT 351 06/15/2020   Attestation Statements:   Reviewed by clinician on day of visit: allergies, medications, problem list, medical history, surgical history, family history, social history, and previous encounter notes.  I, 06/17/2020, CMA, am acting as Insurance claims handler for Energy manager, NP.  I have reviewed the above documentation for accuracy and completeness, and I agree with the above. -  Shaughnessy Gethers d. Mayrene Bastarache, NP-C

## 2021-08-16 ENCOUNTER — Telehealth (INDEPENDENT_AMBULATORY_CARE_PROVIDER_SITE_OTHER): Payer: Self-pay | Admitting: Adult Health

## 2021-08-16 ENCOUNTER — Encounter (INDEPENDENT_AMBULATORY_CARE_PROVIDER_SITE_OTHER): Payer: Self-pay

## 2021-08-16 NOTE — Telephone Encounter (Signed)
Prior authorization for Adam Adams denied. Patient already using coupon savings card. Patient sent mychart message.

## 2021-08-29 ENCOUNTER — Ambulatory Visit (INDEPENDENT_AMBULATORY_CARE_PROVIDER_SITE_OTHER): Payer: Managed Care, Other (non HMO) | Admitting: Adult Health

## 2021-09-19 ENCOUNTER — Encounter (INDEPENDENT_AMBULATORY_CARE_PROVIDER_SITE_OTHER): Payer: Self-pay

## 2021-09-24 ENCOUNTER — Ambulatory Visit (INDEPENDENT_AMBULATORY_CARE_PROVIDER_SITE_OTHER): Payer: Managed Care, Other (non HMO) | Admitting: Adult Health

## 2021-09-24 ENCOUNTER — Encounter (INDEPENDENT_AMBULATORY_CARE_PROVIDER_SITE_OTHER): Payer: Self-pay | Admitting: Adult Health

## 2021-09-24 ENCOUNTER — Other Ambulatory Visit: Payer: Self-pay

## 2021-09-24 VITALS — BP 126/82 | HR 77 | Temp 98.1°F | Ht 68.0 in | Wt 209.0 lb

## 2021-09-24 DIAGNOSIS — E785 Hyperlipidemia, unspecified: Secondary | ICD-10-CM | POA: Diagnosis not present

## 2021-09-24 DIAGNOSIS — R7401 Elevation of levels of liver transaminase levels: Secondary | ICD-10-CM | POA: Diagnosis not present

## 2021-09-24 DIAGNOSIS — E669 Obesity, unspecified: Secondary | ICD-10-CM

## 2021-09-24 DIAGNOSIS — Z6830 Body mass index (BMI) 30.0-30.9, adult: Secondary | ICD-10-CM

## 2021-09-24 DIAGNOSIS — R0989 Other specified symptoms and signs involving the circulatory and respiratory systems: Secondary | ICD-10-CM | POA: Insufficient documentation

## 2021-09-24 DIAGNOSIS — Z9189 Other specified personal risk factors, not elsewhere classified: Secondary | ICD-10-CM

## 2021-09-24 DIAGNOSIS — E559 Vitamin D deficiency, unspecified: Secondary | ICD-10-CM

## 2021-09-24 DIAGNOSIS — R7303 Prediabetes: Secondary | ICD-10-CM

## 2021-09-24 DIAGNOSIS — Z6831 Body mass index (BMI) 31.0-31.9, adult: Secondary | ICD-10-CM

## 2021-09-24 NOTE — Progress Notes (Signed)
Chief Complaint:   OBESITY Adam Adams is here to discuss his progress with his obesity treatment plan along with follow-up of his obesity related diagnoses. Adam Adams is on keeping a food journal and adhering to recommended goals of 1800 calories and 100 grams of protein daily and states he is following his eating plan approximately 50% of the time. Adam Adams states he is walking for 20-30 minutes 7 times per week.  Today's visit was #: 25 Starting weight: 203 lbs Starting date: 03/28/2020 Today's weight: 209 lbs Today's date: 09/24/2021 Total lbs lost to date: 0 Total lbs lost since last in-office visit: 3  Interim History:  Due to worsening polyphagia, pre-diabetes, and insulin resistance, Adam Adams was started on Mounjaro 2.5 mg on 06/21/2021.  After a few months of therapy polyphagia resolved.  Last refill cost was $500.  Last dose of Mounjaro 2.5 mg was 09/19/2021.  He will often experience mild nausea without vomiting after his injection.   Subjective:   1. Pre-diabetes A1c has been fluctuating between 5.3-5.7, last level was 5.7 on 05/23/21 Due to worsening polyphagia, pre-diabetes, and insulin resistance, Adam Adams was started on Mounjaro 2.5 mg on 06/21/2021.  After a few months of therapy polyphagia resolved.  Last refill cost was $500.  Last dose of Mounjaro 2.5 mg was 09/19/2021.  Does not have additional supply of Mounjaro. He will often experience mild nausea without vomiting after his injection.   2. Vitamin D deficiency Adam Adams is on Ergocalciferol, and denies nausea, vomiting, or muscle weakness.  3. Transaminitis Adam Adams reports intermittent right upper quadrant pain. He symptoms last for a few minutes, which he describes as a sharp localized pain without nausea or vomiting.   4. Hyperlipidemia, unspecified hyperlipidemia type Adam Adams denies acute cardiac symptoms. He denies tobacco/vape use.   5. Abdominal aortic pulsation Adam Adams reports an intermittent abdominal pulsation that  began 2020. It will occur monthly to every 3 months.  Symptoms last for 10 minutes, and he feels that he is under stress at the time of symptom onset.  He denies tobacco use/vape use.  He denies a family history of AAA.  His paternal grandfather passed from MI- he was in his 45's. His paternal uncle passed from a MI (unsure of age at TOD).  6. At risk for diabetes mellitus Adam Adams is at higher than average risk for developing diabetes due to worsening pre-diabetes and insulin resistance.   Assessment/Plan:   1. Pre-diabetes We will check labs today. Adam Adams will remain off Mounjaro, and will continue to work on weight loss, exercise, and decreasing simple carbohydrates to help decrease the risk of diabetes.  Discuss GLP-1 or GIP/GLP-1 therapy after lab results.  - Hemoglobin A1c - Insulin, random  2. Vitamin D deficiency We will check labs today. Adam Adams will follow-up for routine testing of Vitamin D, at least 2-3 times per year to avoid over-replacement.  - VITAMIN D 25 Hydroxy (Vit-D Deficiency, Fractures)  3. Transaminitis We will check labs, and will follow up at his next office visit.   - Comprehensive metabolic panel  4. Hyperlipidemia, unspecified hyperlipidemia type Cardiovascular risk and specific lipid/LDL goals reviewed. We discussed several lifestyle modifications today. We will check labs today. Adam Adams will continue to work on diet, exercise and weight loss efforts. Orders and follow up as documented in patient record.   Counseling Intensive lifestyle modifications are the first line treatment for this issue. Dietary changes: Increase soluble fiber. Decrease simple carbohydrates. Exercise changes: Moderate to vigorous-intensity aerobic activity 150 minutes  per week if tolerated. Lipid-lowering medications: see documented in medical record.  - Lipid panel  5. Abdominal aortic pulsation Adam Adams will follow up with his primary care physician.   6. At risk for diabetes  mellitus Adam Adams was given approximately 15 minutes of diabetes education and counseling today. We discussed intensive lifestyle modifications today with an emphasis on weight loss as well as increasing exercise and decreasing simple carbohydrates in his diet. We also reviewed medication options with an emphasis on risk versus benefit of those discussed.   Repetitive spaced learning was employed today to elicit superior memory formation and behavioral change.  7. Current BMI 31.8 Adam Adams is currently in the action stage of change. As such, his goal is to continue with weight loss efforts. He has agreed to keeping a food journal and adhering to recommended goals of 1800 calories and 100 grams of protein daily.   Remain off Mounjaro.   Exercise goals: As is.  Behavioral modification strategies: increasing lean protein intake, decreasing simple carbohydrates, meal planning and cooking strategies, keeping healthy foods in the home, and planning for success.  Adam Adams has agreed to follow-up with our clinic in 3 to 4 weeks. He was informed of the importance of frequent follow-up visits to maximize his success with intensive lifestyle modifications for his multiple health conditions.   Adam Adams was informed we would discuss his lab results at his next visit unless there is a critical issue that needs to be addressed sooner. Adam Adams agreed to keep his next visit at the agreed upon time to discuss these results.  Objective:   Blood pressure 126/82, pulse 77, temperature 98.1 F (36.7 C), height 5\' 8"  (1.727 m), weight 209 lb (94.8 kg), SpO2 100 %. Body mass index is 31.78 kg/m.  General: Cooperative, alert, well developed, in no acute distress. HEENT: Conjunctivae and lids unremarkable. Cardiovascular: Regular rhythm.  Lungs: Normal work of breathing. Neurologic: No focal deficits.   Lab Results  Component Value Date   CREATININE 0.73 (L) 05/23/2021   BUN 18 05/23/2021   NA 142 05/23/2021   K 4.7  05/23/2021   CL 105 05/23/2021   CO2 23 05/23/2021   Lab Results  Component Value Date   ALT 50 (H) 05/23/2021   AST 27 05/23/2021   ALKPHOS 66 05/23/2021   BILITOT 0.4 05/23/2021   Lab Results  Component Value Date   HGBA1C 5.7 (H) 05/23/2021   HGBA1C 5.5 01/02/2021   HGBA1C 5.6 10/26/2020   HGBA1C 5.3 07/04/2020   HGBA1C 5.7 (H) 03/28/2020   Lab Results  Component Value Date   INSULIN 28.9 (H) 05/23/2021   INSULIN 12.5 01/02/2021   INSULIN 19.5 10/26/2020   INSULIN 19.1 07/04/2020   INSULIN 28.7 (H) 03/28/2020   Lab Results  Component Value Date   TSH 0.78 06/15/2020   Lab Results  Component Value Date   CHOL 160 01/02/2021   HDL 51 01/02/2021   LDLCALC 96 01/02/2021   TRIG 68 01/02/2021   CHOLHDL 3.1 01/02/2021   Lab Results  Component Value Date   VD25OH 39.2 05/23/2021   VD25OH 44.7 01/02/2021   VD25OH 32.3 10/26/2020   Lab Results  Component Value Date   WBC 6.2 06/15/2020   HGB 15.6 06/15/2020   HCT 45.4 06/15/2020   MCV 93.0 06/15/2020   PLT 351 06/15/2020   No results found for: IRON, TIBC, FERRITIN  Attestation Statements:   Reviewed by clinician on day of visit: allergies, medications, problem list, medical history, surgical history,  family history, social history, and previous encounter notes.   Trude McburneyI, Sharon Martin, am acting as transcriptionist for William HamburgerKaty Stephene Alegria, NP.  I have reviewed the above documentation for accuracy and completeness, and I agree with the above. -  Jaycey Gens d. Mae Denunzio, NP-C

## 2021-09-26 LAB — LIPID PANEL
Chol/HDL Ratio: 3.2 ratio (ref 0.0–5.0)
Cholesterol, Total: 167 mg/dL (ref 100–199)
HDL: 52 mg/dL (ref >39)
LDL Chol Calc (NIH): 104 mg/dL — ABNORMAL HIGH (ref 0–99)
Triglycerides: 52 mg/dL (ref 0–149)
VLDL Cholesterol Cal: 11 mg/dL (ref 5–40)

## 2021-09-26 LAB — COMPREHENSIVE METABOLIC PANEL
ALT: 42 IU/L (ref 0–44)
AST: 32 IU/L (ref 0–40)
Albumin/Globulin Ratio: 2 (ref 1.2–2.2)
Albumin: 4.9 g/dL (ref 4.0–5.0)
Alkaline Phosphatase: 58 IU/L (ref 44–121)
BUN/Creatinine Ratio: 20 (ref 9–20)
BUN: 16 mg/dL (ref 6–20)
Bilirubin Total: 0.4 mg/dL (ref 0.0–1.2)
CO2: 22 mmol/L (ref 20–29)
Calcium: 9.7 mg/dL (ref 8.7–10.2)
Chloride: 107 mmol/L — ABNORMAL HIGH (ref 96–106)
Creatinine, Ser: 0.81 mg/dL (ref 0.76–1.27)
Globulin, Total: 2.5 g/dL (ref 1.5–4.5)
Glucose: 81 mg/dL (ref 70–99)
Potassium: 4.6 mmol/L (ref 3.5–5.2)
Sodium: 142 mmol/L (ref 134–144)
Total Protein: 7.4 g/dL (ref 6.0–8.5)
eGFR: 120 mL/min/{1.73_m2} (ref >59)

## 2021-09-26 LAB — INSULIN, RANDOM: INSULIN: 15 u[IU]/mL (ref 2.6–24.9)

## 2021-09-26 LAB — VITAMIN D 25 HYDROXY (VIT D DEFICIENCY, FRACTURES): Vit D, 25-Hydroxy: 45.1 ng/mL (ref 30.0–100.0)

## 2021-09-26 LAB — HEMOGLOBIN A1C
Est. average glucose Bld gHb Est-mCnc: 114 mg/dL
Hgb A1c MFr Bld: 5.6 % (ref 4.8–5.6)

## 2021-09-27 ENCOUNTER — Ambulatory Visit (INDEPENDENT_AMBULATORY_CARE_PROVIDER_SITE_OTHER): Payer: Managed Care, Other (non HMO)

## 2021-09-27 ENCOUNTER — Ambulatory Visit (INDEPENDENT_AMBULATORY_CARE_PROVIDER_SITE_OTHER): Payer: Managed Care, Other (non HMO) | Admitting: Sports Medicine

## 2021-09-27 ENCOUNTER — Other Ambulatory Visit: Payer: Self-pay

## 2021-09-27 DIAGNOSIS — R0989 Other specified symptoms and signs involving the circulatory and respiratory systems: Secondary | ICD-10-CM

## 2021-09-27 DIAGNOSIS — R1011 Right upper quadrant pain: Secondary | ICD-10-CM

## 2021-09-27 NOTE — Assessment & Plan Note (Signed)
History is brought this up to me today, his abdominal exam is normal from a AAA standpoint, he is a bit young for AAA, I think he is feeling his normal abdominal aortic pulsations.

## 2021-09-27 NOTE — Progress Notes (Addendum)
° ° °  Procedures performed today:    None.  Independent interpretation of notes and tests performed by another provider:   None.  Brief History, Exam, Impression, and Recommendations:    Abdominal aortic pulsation History is brought this up to me today, his abdominal exam is normal from a AAA standpoint, he is a bit young for AAA, I think he is feeling his normal abdominal aortic pulsations.  RUQ pain Adam Adams is also having a year of right upper quadrant abdominal pain, intermittent, sometimes worsened with greasy foods. Recent labs were for the most part unrevealing. On exam he has a minimally positive Murphy sign, mild tenderness right upper quadrant, he also endorses some melena. Doing an H. pylori test today, will also add on a right upper quadrant ultrasound looking for gallbladder pathology. If ultrasound unrevealing we will do a HIDA scan. If HIDA scan unrevealing I will likely also send him off for an upper endoscopy, he had one about a year and a half ago, I do not have the results but it does not sound like he had any peptic ulcer disease.  Update: H. pylori breath test negative, awaiting ultrasound results, I will restart his acid blocker in the meantime.    Update 2:Fatty liver present, there are also multiple gallbladder polyps, I think we should go ahead and get the HIDA scan.  Update 3: Gallbladder ejection fraction at the bottom of the normal range, he likely needs a cholecystectomy.  Referral to general surgery.  I spent 30 minutes of total time managing this patient today, this includes chart review, face to face, and non-face to face time.  ___________________________________________ Adam Adams. Adam Adams, M.D., ABFM., CAQSM. Primary Care and Sports Medicine [facility]  Adjunct Instructor of Family Medicine  [employer]

## 2021-09-27 NOTE — Assessment & Plan Note (Addendum)
Adam Adams is also having a year of right upper quadrant abdominal pain, intermittent, sometimes worsened with greasy foods. Recent labs were for the most part unrevealing. On exam he has a minimally positive Murphy sign, mild tenderness right upper quadrant, he also endorses some melena. Doing an H. pylori test today, will also add on a right upper quadrant ultrasound looking for gallbladder pathology. If ultrasound unrevealing we will do a HIDA scan. If HIDA scan unrevealing I will likely also send him off for an upper endoscopy, he had one about a year and a half ago, I do not have the results but it does not sound like he had any peptic ulcer disease.  Update: H. pylori breath test negative, awaiting ultrasound results, I will restart his acid blocker in the meantime.    Update 2:Fatty liver present, there are also multiple gallbladder polyps, I think we should go ahead and get the HIDA scan.  Update 3: Gallbladder ejection fraction at the bottom of the normal range, he likely needs a cholecystectomy.  Referral to general surgery.

## 2021-09-27 NOTE — Addendum Note (Signed)
Addended by: Carolin Coy on: 09/27/2021 09:16 AM   Modules accepted: Orders

## 2021-09-28 LAB — H. PYLORI BREATH TEST: H. pylori Breath Test: NOT DETECTED

## 2021-09-28 MED ORDER — PANTOPRAZOLE SODIUM 40 MG PO TBEC
40.0000 mg | DELAYED_RELEASE_TABLET | Freq: Every day | ORAL | 3 refills | Status: DC
Start: 2021-09-28 — End: 2021-12-26

## 2021-09-28 NOTE — Addendum Note (Signed)
Addended by: Monica Becton on: 09/28/2021 01:06 PM   Modules accepted: Orders

## 2021-09-28 NOTE — Addendum Note (Signed)
Addended by: Monica Becton on: 09/28/2021 11:54 AM   Modules accepted: Orders

## 2021-10-04 ENCOUNTER — Ambulatory Visit (HOSPITAL_COMMUNITY): Admission: RE | Admit: 2021-10-04 | Payer: Managed Care, Other (non HMO) | Source: Ambulatory Visit

## 2021-10-09 ENCOUNTER — Other Ambulatory Visit: Payer: Self-pay

## 2021-10-09 ENCOUNTER — Ambulatory Visit (HOSPITAL_COMMUNITY)
Admission: RE | Admit: 2021-10-09 | Discharge: 2021-10-09 | Disposition: A | Discharge status: Home / Self Care | Payer: Managed Care, Other (non HMO) | Source: Ambulatory Visit | Attending: Sports Medicine | Admitting: Sports Medicine

## 2021-10-09 DIAGNOSIS — R1011 Right upper quadrant pain: Secondary | ICD-10-CM | POA: Insufficient documentation

## 2021-10-09 MED ORDER — TECHNETIUM TC 99M MEBROFENIN IV KIT
5.2000 | PACK | Freq: Once | INTRAVENOUS | Status: AC | PRN
  Administered 2021-10-09: 5.3 via INTRAVENOUS

## 2021-10-11 NOTE — Addendum Note (Signed)
Addended by: Silverio Decamp on: 10/11/2021 08:12 AM   Modules accepted: Orders

## 2021-10-15 ENCOUNTER — Encounter: Payer: Self-pay | Admitting: Sports Medicine

## 2021-10-23 ENCOUNTER — Ambulatory Visit (INDEPENDENT_AMBULATORY_CARE_PROVIDER_SITE_OTHER): Payer: Managed Care, Other (non HMO) | Admitting: Adult Health

## 2021-10-30 ENCOUNTER — Ambulatory Visit (INDEPENDENT_AMBULATORY_CARE_PROVIDER_SITE_OTHER): Payer: Managed Care, Other (non HMO) | Admitting: Sports Medicine

## 2021-10-30 ENCOUNTER — Ambulatory Visit (INDEPENDENT_AMBULATORY_CARE_PROVIDER_SITE_OTHER): Payer: Managed Care, Other (non HMO)

## 2021-10-30 ENCOUNTER — Encounter: Payer: Self-pay | Admitting: Sports Medicine

## 2021-10-30 ENCOUNTER — Other Ambulatory Visit: Payer: Self-pay

## 2021-10-30 DIAGNOSIS — M25562 Pain in left knee: Secondary | ICD-10-CM

## 2021-10-30 DIAGNOSIS — Z09 Encounter for follow-up examination after completed treatment for conditions other than malignant neoplasm: Secondary | ICD-10-CM | POA: Diagnosis not present

## 2021-10-30 DIAGNOSIS — G8929 Other chronic pain: Secondary | ICD-10-CM | POA: Diagnosis not present

## 2021-10-30 DIAGNOSIS — K8050 Calculus of bile duct without cholangitis or cholecystitis without obstruction: Secondary | ICD-10-CM | POA: Diagnosis not present

## 2021-10-30 DIAGNOSIS — S83207A Unspecified tear of unspecified meniscus, current injury, left knee, initial encounter: Secondary | ICD-10-CM | POA: Insufficient documentation

## 2021-10-30 MED ORDER — URSODIOL 500 MG PO TABS: 500.0000 mg | ORAL_TABLET | Freq: Two times a day (BID) | ORAL | 11 refills | Status: DC

## 2021-10-30 NOTE — Assessment & Plan Note (Signed)
Adam Adams has also had some pain in his left knee with a buckling that started maybe a week or 2 ago. On exam he has patellofemoral crepitus, tenderness at the anterior/medial joint line. He also has pain with terminal flexion, otherwise the knee is stable. We will start conservatively, x-rays, home conditioning, he does have Mobic at home which she will do for 2 weeks, return to see me in 6 weeks, injection if not better.

## 2021-10-30 NOTE — Assessment & Plan Note (Signed)
Denali returns, he is a pleasant 31 year old male, he had a year of right upper quadrant abdominal pain, intermittent, sometimes worsened with greasy foods. His labs were unrevealing, he did have a minimally positive Murphy sign on exam, H. pylori test was negative, ultrasound showed some fatty liver and also some gallbladder polyps versus stones. We obtained a HIDA scan that showed a gallbladder ejection fraction at the bottom of the normal range. He does likely need a cholecystectomy but he is looking for more of a nonsurgical option for now, considering likely biliary sludge and some gallstones without any evidence of acute cholecystitis we will add ursodiol 500 mg p.o. twice daily, he understands we would need to do this for 6 to 12 months, we will do a 66-month ultrasound to reevaluate.

## 2021-10-30 NOTE — Progress Notes (Signed)
° ° °  Procedures performed today:    None.  Independent interpretation of notes and tests performed by another provider:   None.  Brief History, Exam, Impression, and Recommendations:    Biliary colic Adam Adams returns, he is a pleasant 31 year old male, he had a year of right upper quadrant abdominal pain, intermittent, sometimes worsened with greasy foods. His labs were unrevealing, he did have a minimally positive Murphy sign on exam, H. pylori test was negative, ultrasound showed some fatty liver and also some gallbladder polyps versus stones. We obtained a HIDA scan that showed a gallbladder ejection fraction at the bottom of the normal range. He does likely need a cholecystectomy but he is looking for more of a nonsurgical option for now, considering likely biliary sludge and some gallstones without any evidence of acute cholecystitis we will add ursodiol 500 mg p.o. twice daily, he understands we would need to do this for 6 to 12 months, we will do a 39-month ultrasound to reevaluate.  Chronic pain of left knee Adam Adams has also had some pain in his left knee with a buckling that started maybe a week or 2 ago. On exam he has patellofemoral crepitus, tenderness at the anterior/medial joint line. He also has pain with terminal flexion, otherwise the knee is stable. We will start conservatively, x-rays, home conditioning, he does have Mobic at home which she will do for 2 weeks, return to see me in 6 weeks, injection if not better.  Chronic process with exacerbation and pharmacologic intervention  ___________________________________________ Adam Adams. Adam Adams, M.D., ABFM., CAQSM. Primary Care and Sports Medicine [facility]  Adjunct Instructor of Family Medicine  [employer]

## 2021-11-28 ENCOUNTER — Ambulatory Visit (INDEPENDENT_AMBULATORY_CARE_PROVIDER_SITE_OTHER): Payer: Managed Care, Other (non HMO) | Admitting: Adult Health

## 2021-11-28 ENCOUNTER — Other Ambulatory Visit: Payer: Self-pay

## 2021-11-28 ENCOUNTER — Encounter (INDEPENDENT_AMBULATORY_CARE_PROVIDER_SITE_OTHER): Payer: Self-pay | Admitting: Adult Health

## 2021-11-28 VITALS — BP 131/84 | HR 63 | Temp 98.3°F | Ht 68.0 in | Wt 215.0 lb

## 2021-11-28 DIAGNOSIS — E559 Vitamin D deficiency, unspecified: Secondary | ICD-10-CM | POA: Diagnosis not present

## 2021-11-28 DIAGNOSIS — R7401 Elevation of levels of liver transaminase levels: Secondary | ICD-10-CM

## 2021-11-28 DIAGNOSIS — E669 Obesity, unspecified: Secondary | ICD-10-CM

## 2021-11-28 DIAGNOSIS — R7303 Prediabetes: Secondary | ICD-10-CM

## 2021-11-28 DIAGNOSIS — E785 Hyperlipidemia, unspecified: Secondary | ICD-10-CM

## 2021-11-28 DIAGNOSIS — Z6832 Body mass index (BMI) 32.0-32.9, adult: Secondary | ICD-10-CM

## 2021-11-28 DIAGNOSIS — K8050 Calculus of bile duct without cholangitis or cholecystitis without obstruction: Secondary | ICD-10-CM | POA: Diagnosis not present

## 2021-11-28 MED ORDER — VITAMIN D (ERGOCALCIFEROL) 1.25 MG (50000 UNIT) PO CAPS: 50000.0000 [IU] | ORAL_CAPSULE | ORAL | 0 refills | Status: DC

## 2021-12-04 NOTE — Progress Notes (Signed)
? ? ? ?Chief Complaint:  ? ?OBESITY ?Adam Adams is here to discuss his progress with his obesity treatment plan along with follow-up of his obesity related diagnoses. [name] is on keeping a food journal and adhering to recommended goals of 1800 calories and 100 grams of protein and states he is following his eating plan approximately 0% of the time. [name] states he is walking for 20-30 minutes 7 times per week. ? ?Today's visit was #: 26 ?Starting weight: 203 lbs ?Starting date: 03/28/2020 ?Today's weight: 215 lbs ?Today's date: 11/28/2021 ?Total lbs lost to date: 0 ?Total lbs lost since last in-office visit: 0 ? ?Interim History:  ?[name] is not following any prescribed meal plan. ?He endorses feeling bloated - he estimates to drink 50 water oz/day. ?He denies lower extremity edema. ? ?Subjective:  ? ?1. Vitamin D deficiency ?Discussed labs with patient today.  ?On 09/24/2021, vitamin D level - 45.1. ?He is currently taking prescription ergocalciferol 50,000 IU each week. He denies vomiting or muscle weakness. ?He has been experiencing morning nausea without vomiting - intermittently since age 74 - current age 69. ? ?2. Biliary colic ?Discussed labs with patient today.  ?PCP Notes 10/30/21- ?"right upper quadrant abdominal pain, intermittent, sometimes worsened with greasy foods. ?His labs were unrevealing, he did have a minimally positive Murphy sign on exam, H. pylori test was negative, ultrasound showed some fatty liver and also some gallbladder polyps versus stones. ?We obtained a HIDA scan that showed a gallbladder ejection fraction at the bottom of the normal range. ?He does likely need a cholecystectomy but he is looking for more of a nonsurgical option for now, considering likely biliary sludge and some gallstones without any evidence of acute cholecystitis we will add ursodiol 500 mg p.o. twice daily, he understands we would need to do this for 6 to 12 months, we will do a 68-month ultrasound to reevaluate". ? ?Been  on therapy 4 weeks - endorses palpitations with dizziness and anxiety after evening dose.   ?Morning nausea without vomiting (before meals). ?On 09/24/2021, CMP - LFTs normal. ? ?3. Transaminitis ?Discussed labs with patient today.  ?On 09/24/2021, CMP - LFTs WNL. ?He denies any current abdominal pain. ? ?4. Pre-diabetes ?On 09/24/2021, BG 91, A1c 5.6, insulin level 15.0. ?Trialed on Metformin, Mounjaro- not currently on any BG lowering medications. ? ?5. Hyperlipidemia, unspecified hyperlipidemia type ?Worsening.  Discussed labs with patient today.  ?On 09/24/2021, lipid panel - LDL 104 - all other levels normal. ?He estimates to consume red meat 3 times per week. ?He denies tobacco/vape use. ? ?Assessment/Plan:  ? ?1. Vitamin D deficiency ?Refill ergocalciferol 50,000 IU once weekly. ? ?- Refill Vitamin D, Ergocalciferol, (DRISDOL) 1.25 MG (50000 UNIT) CAPS capsule; Take 1 capsule (50,000 Units total) by mouth every 7 (seven) days.  Dispense: 4 capsule; Refill: 0 ? ?2. Biliary colic ?Continue Actigall 500 mg BID.  Follow-up with PCP if side effects worsen. ? ?3. Transaminitis ?Monitor labs, continue with weight loss efforts. ? ?4. Pre-diabetes ?Decrease sugar/CHO, increase protein. ?Monitor labs. ? ?5. Hyperlipidemia, unspecified hyperlipidemia type ?Continue to decrease saturated fat, decrease red meat from 3x to 2x per week.   ?Continue regular walking. ? ?6. Current BMI 32.8 ? ?[name] is currently in the action stage of change. As such, his goal is to continue with weight loss efforts. He has agreed to practicing portion control and making smarter food choices, such as increasing vegetables and decreasing simple carbohydrates.  ? ?PC/ to reduce plan stress. ? ?1)  Follow-up with Dr. Benjamin Stain on 12/11/2021 - sooner if Actigall causes worsening of SEs. ? ?2) Decrease red meat to from 3 to 2 times every week. ? ?Exercise goals:  As is. ? ?Behavioral modification strategies: increasing water intake, keeping healthy  foods in the home, and planning for success. ? ?[name] has agreed to follow-up with our clinic in 4 weeks. He was informed of the importance of frequent follow-up visits to maximize his success with intensive lifestyle modifications for his multiple health conditions.  ? ?Objective:  ? ?Blood pressure 131/84, pulse 63, temperature 98.3 ?F (36.8 ?C), height 5\' 8"  (1.727 m), weight 215 lb (97.5 kg), SpO2 100 %. ?Body mass index is 32.69 kg/m?. ? ?General: Cooperative, alert, well developed, in no acute distress. ?HEENT: Conjunctivae and lids unremarkable. ?Cardiovascular: Regular rhythm.  ?Lungs: Normal work of breathing. ?Neurologic: No focal deficits.  ? ?Lab Results  ?Component Value Date  ? CREATININE 0.81 09/24/2021  ? BUN 16 09/24/2021  ? NA 142 09/24/2021  ? K 4.6 09/24/2021  ? CL 107 (H) 09/24/2021  ? CO2 22 09/24/2021  ? ?Lab Results  ?Component Value Date  ? ALT 42 09/24/2021  ? AST 32 09/24/2021  ? ALKPHOS 58 09/24/2021  ? BILITOT 0.4 09/24/2021  ? ?Lab Results  ?Component Value Date  ? HGBA1C 5.6 09/24/2021  ? HGBA1C 5.7 (H) 05/23/2021  ? HGBA1C 5.5 01/02/2021  ? HGBA1C 5.6 10/26/2020  ? HGBA1C 5.3 07/04/2020  ? ?Lab Results  ?Component Value Date  ? INSULIN 15.0 09/24/2021  ? INSULIN 28.9 (H) 05/23/2021  ? INSULIN 12.5 01/02/2021  ? INSULIN 19.5 10/26/2020  ? INSULIN 19.1 07/04/2020  ? ?Lab Results  ?Component Value Date  ? TSH 0.78 06/15/2020  ? ?Lab Results  ?Component Value Date  ? CHOL 167 09/24/2021  ? HDL 52 09/24/2021  ? LDLCALC 104 (H) 09/24/2021  ? TRIG 52 09/24/2021  ? CHOLHDL 3.2 09/24/2021  ? ?Lab Results  ?Component Value Date  ? VD25OH 45.1 09/24/2021  ? VD25OH 39.2 05/23/2021  ? VD25OH 44.7 01/02/2021  ? ?Lab Results  ?Component Value Date  ? WBC 6.2 06/15/2020  ? HGB 15.6 06/15/2020  ? HCT 45.4 06/15/2020  ? MCV 93.0 06/15/2020  ? PLT 351 06/15/2020  ? ?Attestation Statements:  ? ?Reviewed by clinician on day of visit: allergies, medications, problem list, medical history, surgical history,  family history, social history, and previous encounter notes. ? ?I, 06/17/2020, CMA, am acting as Insurance claims handler for Energy manager, NP. ? ?I have reviewed the above documentation for accuracy and completeness, and I agree with the above. -  Argie Applegate d. Arvin Abello, NP-C ?

## 2021-12-11 ENCOUNTER — Ambulatory Visit: Payer: Managed Care, Other (non HMO) | Admitting: Sports Medicine

## 2021-12-26 ENCOUNTER — Other Ambulatory Visit: Payer: Self-pay | Admitting: Sports Medicine

## 2021-12-26 DIAGNOSIS — R1011 Right upper quadrant pain: Secondary | ICD-10-CM

## 2022-01-02 ENCOUNTER — Encounter (INDEPENDENT_AMBULATORY_CARE_PROVIDER_SITE_OTHER): Payer: Self-pay | Admitting: Adult Health

## 2022-01-02 ENCOUNTER — Ambulatory Visit (INDEPENDENT_AMBULATORY_CARE_PROVIDER_SITE_OTHER): Payer: Managed Care, Other (non HMO) | Admitting: Adult Health

## 2022-01-02 VITALS — BP 114/73 | HR 85 | Temp 98.1°F | Ht 68.0 in | Wt 213.0 lb

## 2022-01-02 DIAGNOSIS — E669 Obesity, unspecified: Secondary | ICD-10-CM

## 2022-01-02 DIAGNOSIS — E559 Vitamin D deficiency, unspecified: Secondary | ICD-10-CM | POA: Diagnosis not present

## 2022-01-02 DIAGNOSIS — M25562 Pain in left knee: Secondary | ICD-10-CM | POA: Diagnosis not present

## 2022-01-02 DIAGNOSIS — Z6830 Body mass index (BMI) 30.0-30.9, adult: Secondary | ICD-10-CM | POA: Diagnosis not present

## 2022-01-02 DIAGNOSIS — Z9189 Other specified personal risk factors, not elsewhere classified: Secondary | ICD-10-CM

## 2022-01-02 MED ORDER — VITAMIN D (ERGOCALCIFEROL) 1.25 MG (50000 UNIT) PO CAPS: 50000.0000 [IU] | ORAL_CAPSULE | ORAL | 0 refills | Status: DC

## 2022-01-13 NOTE — Progress Notes (Addendum)
Chief Complaint:   OBESITY Adam Adams is here to discuss his progress with his obesity treatment plan along with follow-up of his obesity related diagnoses. Adam Adams is on none of the PC/[place] plan and states he is following his eating plan approximately 0% of the time. Adam Adams states he is not exercising due to injury.  Today's visit was #: 27 Starting weight: 203 Starting date: 03/28/2020 Today's weight: 213 Today's date: 12/03/2021 Total lbs lost to date: 0 Total lbs lost since last in-office visit: 2  Interim History:  Adam Adams was doing a 90 pound sled pull of 25 meters- out and back, during an [employer] test on 12/02/2021.   He then developed left knee pain and was sent to local UC later same day. 12/02/21 urgent care reviewed notes with patient.   He had x-rays of left shoulder, left hip and left knee.   He was provided Prednisone, Tylenol and Flexeril.    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  Subjective:   1. Vitamin D deficiency Discussed with patient labs from 09/24/21; Vitamin D level 45.1.   He is on Ergocalciferol-denies nausea, vomiting and muscle weakness.   2. Acute pain of left knee Adam Adams was doing a 90 pound sled pull of 25 meters- out and back, during an [employer] test on 12/02/2021.   He then developed left knee pain and was sent to local UC later same day. 12/02/21 urgent care reviewed notes with patient.   He had x-rays of left shoulder, left hip and left knee.   He was provided Prednisone, Tylenol and Flexeril.   His current left knee pain scale is 0/10 at rest and 6/10 with use.   He describes this as a sharp pain.  He endorses "feeling of instability" of LLE.  3. At risk for activity intolerance At risk due to injury of LLE s/p Energy manager.    Assessment/Plan:   1. Vitamin D deficiency Will refill Ergocaciferol 50,000 units  - Vitamin D, Ergocalciferol, (DRISDOL) 1.25 MG (50000 UNIT)  CAPS capsule; Take 1 capsule (50,000 Units total) by mouth every 7 (seven) days.  Dispense: 4 capsule; Refill: 0  2. Acute pain of left knee Adam Adams will follow up with [employer] [facility] directed Activity as tolerated.  3. At risk for activity intolerance Recent LLE injury. F/u with specialists as directed.  4. Current BMI 32.4 Adam Adams is currently in the action stage of change. As such, his goal is to continue with weight loss efforts. He has agreed to 30 gram Protein at each major meal and also utilize Adam Adams.  Exercise goals: Advance activity as tolerated  Behavioral modification strategies: increasing lean protein intake, decreasing simple carbohydrates, meal planning and cooking strategies, keeping healthy foods in the home, and planning for success.  Adam Adams has agreed to follow-up with our clinic in 4 weeks. He was informed of the importance of frequent follow-up visits to maximize his success with intensive lifestyle modifications for his multiple health conditions.    Objective:   Blood pressure 114/73, pulse 85, temperature 98.1 F (36.7 C), height 5\' 8"  (1.727 m), weight 213 lb (96.6 kg), SpO2 98 %. Body mass index is 32.39 kg/m.  General: Cooperative, alert, well developed, in no acute distress. HEENT: Conjunctivae and lids unremarkable. Cardiovascular: Regular rhythm.  Lungs: Normal work of breathing. Neurologic: No focal deficits.   Lab Results  Component Value  Date   CREATININE 0.81 09/24/2021   BUN 16 09/24/2021   NA 142 09/24/2021   K 4.6 09/24/2021   CL 107 (H) 09/24/2021   CO2 22 09/24/2021   Lab Results  Component Value Date   ALT 42 09/24/2021   AST 32 09/24/2021   ALKPHOS 58 09/24/2021   BILITOT 0.4 09/24/2021   Lab Results  Component Value Date   HGBA1C 5.6 09/24/2021   HGBA1C 5.7 (H) 05/23/2021   HGBA1C 5.5 01/02/2021   HGBA1C 5.6 10/26/2020   HGBA1C 5.3 07/04/2020   Lab Results  Component Value Date   INSULIN 15.0 09/24/2021    INSULIN 28.9 (H) 05/23/2021   INSULIN 12.5 01/02/2021   INSULIN 19.5 10/26/2020   INSULIN 19.1 07/04/2020   Lab Results  Component Value Date   TSH 0.78 06/15/2020   Lab Results  Component Value Date   CHOL 167 09/24/2021   HDL 52 09/24/2021   LDLCALC 104 (H) 09/24/2021   TRIG 52 09/24/2021   CHOLHDL 3.2 09/24/2021   Lab Results  Component Value Date   VD25OH 45.1 09/24/2021   VD25OH 39.2 05/23/2021   VD25OH 44.7 01/02/2021   Lab Results  Component Value Date   WBC 6.2 06/15/2020   HGB 15.6 06/15/2020   HCT 45.4 06/15/2020   MCV 93.0 06/15/2020   PLT 351 06/15/2020   No results found for: IRON, TIBC, FERRITIN   Attestation Statements:   Reviewed by clinician on day of visit: allergies, medications, problem list, medical history, surgical history, family history, social history, and previous encounter notes.  I, Janey Greaser, am acting as Energy manager for William Hamburger, NP.  I have reviewed the above documentation for accuracy and completeness, and I agree with the above. -  Masiyah Jorstad d. Barbarita Hutmacher, NP-C

## 2022-02-04 ENCOUNTER — Ambulatory Visit (INDEPENDENT_AMBULATORY_CARE_PROVIDER_SITE_OTHER): Payer: Managed Care, Other (non HMO) | Admitting: Adult Health

## 2022-02-23 IMAGING — NM NM HEPATO W/GB/PHARM/[PERSON_NAME]
2 series · 12 of 12 positions shown · non-contrast
Comparison: Ultrasound September 27, 2021

CLINICAL DATA: Recurrent biliary colic concern for gallbladder
dyskinesia.

EXAM:
NUCLEAR MEDICINE HEPATOBILIARY IMAGING WITH GALLBLADDER EF
TECHNIQUE: Sequential images of the abdomen were obtained [DATE] minutes
following intravenous administration of radiopharmaceutical. After
oral ingestion of Ensure, gallbladder ejection fraction was
determined. At 60 min, normal ejection fraction is greater than 33%.
RADIOPHARMACEUTICALS:  5.3 mCi 7c-ZZm  Choletec IV

[Series 1: biliary · 3.25mm/px · 6 of 60 frames shown]
[frame 6/60]
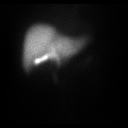
[frame 16/60]
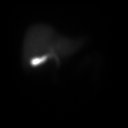
[frame 26/60]
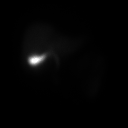
[frame 36/60]
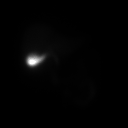
[frame 46/60]
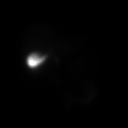
[frame 56/60]
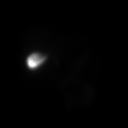

[Series 2: gbef · 3.25mm/px · 6 of 60 frames shown]
[frame 6/60]
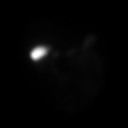
[frame 16/60]
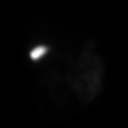
[frame 26/60]
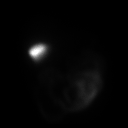
[frame 36/60]
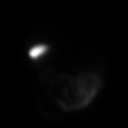
[frame 46/60]
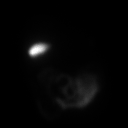
[frame 56/60]
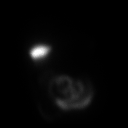

[12 of 12 positions shown; findings below may reference images not displayed]

FINDINGS: There is prompt, uniform radiotracer uptake by the liver with normal
filling of the intrahepatic ducts, common bile duct. Gallbladder
activity is visualized, consistent with patency of cystic duct
(normal < 60 minutes). Additionally there is normal biliary to bowel
transit (normal < 60 minutes), consistent with patent common bile
duct.

Ensure was administered and the gallbladder appears to empty
normally on sequential images. Calculated gallbladder ejection
fraction is 36%. (Normal gallbladder ejection fraction with Ensure
is greater than 33%.)
IMPRESSION: 1.  Low normal gallbladder ejection fraction.

2.  Patent cystic and common bile ducts.

## 2022-04-10 ENCOUNTER — Encounter (INDEPENDENT_AMBULATORY_CARE_PROVIDER_SITE_OTHER): Payer: Self-pay

## 2022-06-12 ENCOUNTER — Encounter: Payer: Self-pay | Admitting: Sports Medicine

## 2022-06-12 ENCOUNTER — Ambulatory Visit (INDEPENDENT_AMBULATORY_CARE_PROVIDER_SITE_OTHER)

## 2022-06-12 ENCOUNTER — Ambulatory Visit (INDEPENDENT_AMBULATORY_CARE_PROVIDER_SITE_OTHER): Admitting: Sports Medicine

## 2022-06-12 DIAGNOSIS — Z23 Encounter for immunization: Secondary | ICD-10-CM

## 2022-06-12 DIAGNOSIS — M25331 Other instability, right wrist: Secondary | ICD-10-CM | POA: Diagnosis not present

## 2022-06-12 DIAGNOSIS — M25562 Pain in left knee: Secondary | ICD-10-CM | POA: Diagnosis not present

## 2022-06-12 DIAGNOSIS — G8929 Other chronic pain: Secondary | ICD-10-CM

## 2022-06-12 DIAGNOSIS — S83207D Unspecified tear of unspecified meniscus, current injury, left knee, subsequent encounter: Secondary | ICD-10-CM

## 2022-06-12 NOTE — Assessment & Plan Note (Signed)
Adam Adams returns, he is a very pleasant 31 year old male, chronic right wrist pain, in the distant past we did a MR arthrogram that showed him small scapholunate injury, we did refer him to hand surgery, it was suggested that the scapholunate tear was so small that it was not worth operative intervention. His last injection was a long time ago, recurrence of pain, repeated injection, return to see me as needed for this.

## 2022-06-12 NOTE — Assessment & Plan Note (Addendum)
Jamarii is also been having significant pain in the left knee, medial joint line, he does okay with stairs. He did have another injury per his report with the [employer]. X-rays were earlier this year, persistent pain in spite of greater than 6 weeks of conservative treatment, proceeding with MRI.  Update: MRI confirms meniscal tearing, referral to Dr. Griffin Basil.

## 2022-06-12 NOTE — Addendum Note (Signed)
Addended by: Tarri Glenn A on: 06/12/2022 09:18 AM   Modules accepted: Orders

## 2022-06-12 NOTE — Progress Notes (Addendum)
    Procedures performed today:    Procedure: Real-time Ultrasound Guided injection of the right radiocarpal joint Device: Samsung HS60  Verbal informed consent obtained.  Time-out conducted.  Noted no overlying erythema, induration, or other signs of local infection.  Skin prepped in a sterile fashion.  Local anesthesia: Topical Ethyl chloride.  With sterile technique and under real time ultrasound guidance: Noted mild synovitis, 1 cc lidocaine, 1 cc kenalog 40, 1 cc bupivacaine injected easily. Completed without difficulty  Advised to call if fevers/chills, erythema, induration, drainage, or persistent bleeding.  Images permanently stored and available for review in PACS.  Impression: Technically successful ultrasound guided injection.  Independent interpretation of notes and tests performed by another provider:   None.  Brief History, Exam, Impression, and Recommendations:    Scapholunate dissociation of right wrist Adam Adams returns, he is a very pleasant 31 year old male, chronic right wrist pain, in the distant past we did a MR arthrogram that showed him small scapholunate injury, we did refer him to hand surgery, it was suggested that the scapholunate tear was so small that it was not worth operative intervention. His last injection was a long time ago, recurrence of pain, repeated injection, return to see me as needed for this.  Acute meniscal tear of left knee Adam Adams is also been having significant pain in the left knee, medial joint line, he does okay with stairs. He did have another injury per his report with the [employer]. X-rays were earlier this year, persistent pain in spite of greater than 6 weeks of conservative treatment, proceeding with MRI.  Update: MRI confirms meniscal tearing, referral to Dr. Griffin Basil.    ____________________________________________ Gwen Her. Dianah Field, M.D., ABFM., CAQSM., AME. Primary Care and Sports Medicine [facility]  [facility]  Adjunct Professor of [employer] of Medicine  Risk manager

## 2022-06-16 ENCOUNTER — Other Ambulatory Visit

## 2022-06-23 ENCOUNTER — Ambulatory Visit (INDEPENDENT_AMBULATORY_CARE_PROVIDER_SITE_OTHER)

## 2022-06-23 DIAGNOSIS — G8929 Other chronic pain: Secondary | ICD-10-CM

## 2022-06-23 DIAGNOSIS — M25562 Pain in left knee: Secondary | ICD-10-CM | POA: Diagnosis not present

## 2022-06-26 NOTE — Addendum Note (Signed)
Addended by: Silverio Decamp on: 06/26/2022 09:41 AM   Modules accepted: Orders

## 2022-07-24 ENCOUNTER — Ambulatory Visit: Admitting: Sports Medicine

## 2022-07-29 ENCOUNTER — Ambulatory Visit: Admitting: Sports Medicine

## 2022-07-29 ENCOUNTER — Encounter: Payer: Self-pay | Admitting: Sports Medicine

## 2022-07-29 DIAGNOSIS — S83207D Unspecified tear of unspecified meniscus, current injury, left knee, subsequent encounter: Secondary | ICD-10-CM

## 2022-07-29 DIAGNOSIS — M25331 Other instability, right wrist: Secondary | ICD-10-CM

## 2022-07-29 DIAGNOSIS — L731 Pseudofolliculitis barbae: Secondary | ICD-10-CM

## 2022-07-29 NOTE — Assessment & Plan Note (Signed)
Adam Adams also has chronic right wrist pain, he does have what appear to be a scapholunate injury on an MR arthrogram, the scapholunate tear was so small that it was not worth operative intervention with hand surgery, so he gets occasional injections into the radiocarpal joint, last injection was in October and he is doing really well from his wrist standpoint.

## 2022-07-29 NOTE — Assessment & Plan Note (Signed)
This is a very pleasant 31 year old male, he has a long history of problems with his facial skin, his beard, when he shaves he gets significant papules and pustules, infections, and scarring. 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 We filled out some documentation today for the [employer].

## 2022-07-29 NOTE — Progress Notes (Signed)
    Procedures performed today:    None.  Independent interpretation of notes and tests performed by another provider:   None.  Brief History, Exam, Impression, and Recommendations:    Pseudofolliculitis barbae This is a very pleasant 31 year old male, he has a long history of problems with his facial skin, his beard, when he shaves he gets significant papules and pustules, infections, and scarring. 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 We filled out some documentation today for the [employer].  Scapholunate dissociation of right wrist Wynton also has chronic right wrist pain, he does have what appear to be a scapholunate injury on an MR arthrogram, the scapholunate tear was so small that it was not worth operative intervention with hand surgery, so he gets occasional injections into the radiocarpal joint, last injection was in October and he is doing really well from his wrist standpoint.  Acute meniscal tear of left knee Has his also has significant left knee pain, this is related to an injury in the [employer], he ultimately had an MRI that showed a vertical tear medial meniscus and was referred to Dr. Everardo Pacific, there has been some difficulty getting in touch with Dr. Austin Miles office, he does need arthroscopic intervention as he is having locking, catching, and he is interested in proceeding with operative arthroscopy.  I spent 30 minutes of total time managing this patient today, this includes chart review, face to face, and non-face to face time.  ____________________________________________ [name]. Benjamin Stain, M.D., ABFM., CAQSM., AME. Primary Care and Sports Medicine [facility]  Adjunct Professor of Family Medicine  [employer]  Restaurant manager, fast food

## 2022-07-29 NOTE — Assessment & Plan Note (Signed)
Has his also has significant left knee pain, this is related to an injury in the [employer], he ultimately had an MRI that showed a vertical tear medial meniscus and was referred to Dr. Everardo Pacific, there has been some difficulty getting in touch with Dr. Austin Miles office, he does need arthroscopic intervention as he is having locking, catching, and he is interested in proceeding with operative arthroscopy.

## 2023-06-30 ENCOUNTER — Ambulatory Visit: Admitting: Sports Medicine

## 2023-07-11 ENCOUNTER — Encounter: Payer: Self-pay | Admitting: Sports Medicine

## 2023-07-11 ENCOUNTER — Ambulatory Visit (INDEPENDENT_AMBULATORY_CARE_PROVIDER_SITE_OTHER): Admitting: Sports Medicine

## 2023-07-11 DIAGNOSIS — R21 Rash and other nonspecific skin eruption: Secondary | ICD-10-CM

## 2023-07-11 DIAGNOSIS — E782 Mixed hyperlipidemia: Secondary | ICD-10-CM | POA: Diagnosis not present

## 2023-07-11 DIAGNOSIS — R6889 Other general symptoms and signs: Secondary | ICD-10-CM | POA: Diagnosis not present

## 2023-07-11 MED ORDER — CLOBETASOL PROPIONATE 0.05 % EX CREA: 1.0000 | TOPICAL_CREAM | Freq: Two times a day (BID) | CUTANEOUS | 0 refills | Status: DC

## 2023-07-11 NOTE — Assessment & Plan Note (Signed)
Adam Adams has noticed over the past several weeks increasing periods of inattentiveness, difficulty focusing. He also endorses brain fog. He does use a large amount of caffeine, around 400 mg/day. He also has a history of mood disorder and fibromyalgia. I explained to him the multifactorial nature of inattentiveness, we will get a lab workup including TSH and vitamin B12, I would also like a neuropsychological workup/ADHD testing.

## 2023-07-11 NOTE — Progress Notes (Signed)
    Procedures performed today:    None.  Independent interpretation of notes and tests performed by another provider:   None.  Brief History, Exam, Impression, and Recommendations:    Skin rash Minimally erythematous scaly rash right knee present for months, question early psoriasis. Adding topical clobetasol, return to see me in 2 to 4 weeks for this.  Episodes of decreased attentiveness Adam Adams has noticed over the past several weeks increasing periods of inattentiveness, difficulty focusing. He also endorses brain fog. He does use a large amount of caffeine, around 400 mg/day. He also has a history of mood disorder and fibromyalgia. I explained to him the multifactorial nature of inattentiveness, we will get a lab workup including TSH and vitamin B12, I would also like a neuropsychological workup/ADHD testing.  I spent 30 minutes of total time managing this patient today, this includes chart review, face to face, and non-face to face time.  ____________________________________________ [name]. Benjamin Stain, M.D., ABFM., CAQSM., AME. Primary Care and Sports Medicine [facility]  Adjunct Professor of Family Medicine  [employer] [facility] [employer] Medicine  Restaurant manager, fast food

## 2023-07-11 NOTE — Assessment & Plan Note (Signed)
Minimally erythematous scaly rash right knee present for months, question early psoriasis. Adding topical clobetasol, return to see me in 2 to 4 weeks for this.

## 2023-07-12 LAB — LIPID PANEL
Chol/HDL Ratio: 3.5 ratio (ref 0.0–5.0)
Cholesterol, Total: 181 mg/dL (ref 100–199)
HDL: 51 mg/dL (ref >39)
LDL Chol Calc (NIH): 113 mg/dL — ABNORMAL HIGH (ref 0–99)
Triglycerides: 92 mg/dL (ref 0–149)
VLDL Cholesterol Cal: 17 mg/dL (ref 5–40)

## 2023-07-12 LAB — CBC
Hematocrit: 46.8 % (ref 37.5–51.0)
Hemoglobin: 15.6 g/dL (ref 13.0–17.7)
MCH: 31.4 pg (ref 26.6–33.0)
MCHC: 33.3 g/dL (ref 31.5–35.7)
MCV: 94 fL (ref 79–97)
Platelets: 365 10*3/uL (ref 150–450)
RBC: 4.97 x10E6/uL (ref 4.14–5.80)
RDW: 12.3 % (ref 11.6–15.4)
WBC: 7.9 10*3/uL (ref 3.4–10.8)

## 2023-07-12 LAB — COMPREHENSIVE METABOLIC PANEL
ALT: 82 [IU]/L — ABNORMAL HIGH (ref 0–44)
AST: 41 [IU]/L — ABNORMAL HIGH (ref 0–40)
Albumin: 4.8 g/dL (ref 4.1–5.1)
Alkaline Phosphatase: 71 [IU]/L (ref 44–121)
BUN/Creatinine Ratio: 21 — ABNORMAL HIGH (ref 9–20)
BUN: 18 mg/dL (ref 6–20)
Bilirubin Total: 0.4 mg/dL (ref 0.0–1.2)
CO2: 20 mmol/L (ref 20–29)
Calcium: 10 mg/dL (ref 8.7–10.2)
Chloride: 102 mmol/L (ref 96–106)
Creatinine, Ser: 0.84 mg/dL (ref 0.76–1.27)
Globulin, Total: 2.5 g/dL (ref 1.5–4.5)
Glucose: 92 mg/dL (ref 70–99)
Potassium: 5.1 mmol/L (ref 3.5–5.2)
Sodium: 140 mmol/L (ref 134–144)
Total Protein: 7.3 g/dL (ref 6.0–8.5)
eGFR: 117 mL/min/{1.73_m2} (ref >59)

## 2023-07-12 LAB — HEMOGLOBIN A1C
Est. average glucose Bld gHb Est-mCnc: 120 mg/dL
Hgb A1c MFr Bld: 5.8 % — ABNORMAL HIGH (ref 4.8–5.6)

## 2023-07-12 LAB — VITAMIN B12: Vitamin B-12: 502 pg/mL (ref 232–1245)

## 2023-07-12 LAB — TSH: TSH: 0.961 u[IU]/mL (ref 0.450–4.500)

## 2023-09-10 ENCOUNTER — Ambulatory Visit (INDEPENDENT_AMBULATORY_CARE_PROVIDER_SITE_OTHER): Admitting: Sports Medicine

## 2023-09-10 VITALS — BP 136/85 | HR 89 | Ht 68.0 in | Wt 232.0 lb

## 2023-09-10 DIAGNOSIS — L568 Other specified acute skin changes due to ultraviolet radiation: Secondary | ICD-10-CM

## 2023-09-10 DIAGNOSIS — R21 Rash and other nonspecific skin eruption: Secondary | ICD-10-CM

## 2023-09-10 NOTE — Progress Notes (Signed)
    Procedures performed today:    None.  Independent interpretation of notes and tests performed by another provider:   None.  Brief History, Exam, Impression, and Recommendations:    Photosensitivity Bralyn needed documentation for photosensitivity to get better tent on his car.  Forms were filled out today.  Skin rash Psoriatic appearing rash right knee much better with clobetasol , still somewhat xerotic, he understands to apply emollients, return as needed.    ____________________________________________ Debby PARAS. Curtis, M.D., ABFM., CAQSM., AME. Primary Care and Sports Medicine [facility]  Adjunct Professor of [facility] Medicine  [employer]  Restaurant Manager, Fast [employer]

## 2023-09-10 NOTE — Assessment & Plan Note (Signed)
 Freddi needed documentation for photosensitivity to get better tent on his car.  Forms were filled out today.

## 2023-09-10 NOTE — Assessment & Plan Note (Signed)
 Psoriatic appearing rash right knee much better with clobetasol, still somewhat xerotic, he understands to apply emollients, return as needed.

## 2023-11-18 ENCOUNTER — Encounter: Payer: Self-pay | Admitting: Sports Medicine

## 2023-11-18 ENCOUNTER — Ambulatory Visit

## 2023-11-18 ENCOUNTER — Ambulatory Visit: Admitting: Sports Medicine

## 2023-11-18 DIAGNOSIS — R200 Anesthesia of skin: Secondary | ICD-10-CM

## 2023-11-18 DIAGNOSIS — F3289 Other specified depressive episodes: Secondary | ICD-10-CM

## 2023-11-18 DIAGNOSIS — F411 Generalized anxiety disorder: Secondary | ICD-10-CM | POA: Diagnosis not present

## 2023-11-18 DIAGNOSIS — M5030 Other cervical disc degeneration, unspecified cervical region: Secondary | ICD-10-CM | POA: Diagnosis not present

## 2023-11-18 MED ORDER — DULOXETINE HCL 30 MG PO CPEP: 30.0000 mg | ORAL_CAPSULE | Freq: Every day | ORAL | 3 refills | Status: DC

## 2023-11-18 NOTE — Assessment & Plan Note (Signed)
 Adam Adams has started to have some numbness on the left side of his face, he also endorses numbness going down the left arm.  He does have history of very mild cervical DDD. Denies any preceding or post eating headache, visual changes. He is having some increasing anxiety. Neurologic exam today is normal, cranial nerves II through XII are intact, motor, sensory, coordination functions are all intact, no dysmetria, no dysdiadochokinesis. I do think we should rule out intracranial pathology with a brain MRI with and without contrast for now.

## 2023-11-18 NOTE — Progress Notes (Signed)
    Procedures performed today:    None.  Independent interpretation of notes and tests performed by another provider:   None.  Brief History, Exam, Impression, and Recommendations:    Generalized anxiety disorder Increasing anxiety symptoms, he was on duloxetine, symptoms were well-controlled, restarting this, recheck in 6 weeks.  Left facial numbness Adam Adams has started to have some numbness on the left side of his face, he also endorses numbness going down the left arm.  He does have history of very mild cervical DDD. Denies any preceding or post eating headache, visual changes. He is having some increasing anxiety. Neurologic exam today is normal, cranial nerves II through XII are intact, motor, sensory, coordination functions are all intact, no dysmetria, no dysdiadochokinesis. I do think we should rule out intracranial pathology with a brain MRI with and without contrast for now.  DDD (degenerative disc disease), cervical Very mild cervical disc bulges, he is having increasing radiation with numbness down the left arm. As this has been present now for several months and is associated with facial numbness I do think we need to image his cervical spine as well as brain to ensure no demyelinating processes.    ____________________________________________ Adam Adams. Benjamin Stain, M.D., ABFM., CAQSM., AME. Primary Care and Sports Medicine  [facility]  Adjunct Professor of Family Medicine  [employer]  Restaurant manager, fast food

## 2023-11-18 NOTE — Assessment & Plan Note (Signed)
 Very mild cervical disc bulges, he is having increasing radiation with numbness down the left arm. As this has been present now for several months and is associated with facial numbness I do think we need to image his cervical spine as well as brain to ensure no demyelinating processes.

## 2023-11-18 NOTE — Assessment & Plan Note (Signed)
 Increasing anxiety symptoms, he was on duloxetine, symptoms were well-controlled, restarting this, recheck in 6 weeks.

## 2023-11-25 ENCOUNTER — Ambulatory Visit

## 2023-11-25 DIAGNOSIS — R200 Anesthesia of skin: Secondary | ICD-10-CM

## 2023-11-25 DIAGNOSIS — R519 Headache, unspecified: Secondary | ICD-10-CM

## 2023-11-25 MED ORDER — GADOBUTROL 1 MMOL/ML IV SOLN
10.0000 mL | Freq: Once | INTRAVENOUS | Status: AC | PRN
  Administered 2023-11-25: 10 mL via INTRAVENOUS

## 2023-11-27 ENCOUNTER — Encounter: Payer: Self-pay | Admitting: Sports Medicine

## 2023-12-12 ENCOUNTER — Other Ambulatory Visit: Payer: Self-pay | Admitting: Sports Medicine

## 2023-12-12 DIAGNOSIS — F3289 Other specified depressive episodes: Secondary | ICD-10-CM

## 2023-12-15 ENCOUNTER — Telehealth: Payer: Self-pay

## 2023-12-15 NOTE — Telephone Encounter (Signed)
 Yes please, it has been way too long.

## 2023-12-15 NOTE — Telephone Encounter (Signed)
 Spoke with Gabe at radiology reading room - moved both MRI's up to STAT Patient informed by phone that we will reach out to him once the results have been read by  Dr. Sandy Crumb.

## 2023-12-15 NOTE — Telephone Encounter (Signed)
 Copied from CRM [id number]. Topic: Clinical - Lab/Test Results >> Dec 15, 2023 11:41 AM Adam Adams F wrote: Reason for CRM:   Patient called in reference to getting the results for his MRI for the spine and brain WO contrast. Patient has yet to hear anything back and is scheduled for a follow up with PCP on April 29 th and would like to have the results before that appointment. 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   Callback Number: [phone]

## 2023-12-15 NOTE — Telephone Encounter (Signed)
 The MRI's have not resulted. Dated 11/25/23.  Would you like me to call radiology reading room and have changed to stat read?

## 2023-12-30 ENCOUNTER — Encounter: Payer: Self-pay | Admitting: Sports Medicine

## 2023-12-30 ENCOUNTER — Ambulatory Visit: Admitting: Sports Medicine

## 2023-12-30 ENCOUNTER — Ambulatory Visit

## 2023-12-30 VITALS — BP 106/65 | HR 74 | Resp 20 | Ht 68.0 in | Wt 231.0 lb

## 2023-12-30 DIAGNOSIS — R200 Anesthesia of skin: Secondary | ICD-10-CM

## 2023-12-30 DIAGNOSIS — R0789 Other chest pain: Secondary | ICD-10-CM

## 2023-12-30 NOTE — Progress Notes (Signed)
 Procedures performed today:    None.  Independent interpretation of notes and tests performed by another provider:   None.  Brief History, Exam, Impression, and Recommendations:    Left facial numbness This is a pleasant previous healthy 33 year old male, we saw him back in March with vague numbness left side of the face without subsequent headache. He also endorses numbness going down the left arm, there was a history of potentially very mild cervical DDD, no visual changes, he was having increasing anxiety at the time. Neurologic exam at the last visit was normal, cranial nerves II through XII are intact, motor, sensory, coordination functions were all intact, he had no dysmetria, no dysdiadochokinesis. We did obtain cervical spine and brain MRIs with and without contrast, they were all normal, there was mild age appropriate noncompressive disc pathology in the cervical spine but this did not explain any paresthesias. These have improved to some degree.  He was then seen in the emergency department for chest pain, ultimately went to cardiology in an outpatient setting, treadmill stress test was negative.  I did learn that along the way he self discontinued his Cymbalta .  I am having a difficult time explaining these migratory symptoms, I do think there may be some somatization, we will go ahead and get upper and lower extremity nerve conduction and EMG to ensure that there is no compressive or neuropathic disease, I explained to him that if all of the above was negative there is no medical explanation for his symptoms and that they were likely related to anxiety.  Right leg numbness History is also has a history of chronic axial low back pain. He more recently endorsed a burning sensation through the medial right thigh, medial knee to the medial calf but not past. He has not had any bowel or bladder dysfunction, no overt saddle numbness, no constitutional symptoms. His hip exam is  completely normal today, he has a negative internal straight leg raise. Lumbar spine x-rays in the past have been normal. He did recently self discontinue Cymbalta  but tells me he was only taking it for about 10 days. He has been having some other migratory paresthesias including facial and left arm numbness with normal brain and cervical spine advanced imaging. In addition, as above he had an episode of chest pain that led him to the ED, ultimately outpatient treadmill stress testing was normal, he has no risk factors for cerebrovascular disease. We will go ahead and proceed with lumbar spine MRI, I would also like upper and lower extremity nerve conduction and EMG, I explained him if all of the above were normal then his symptoms really had no medical explanation and that he should learn to avoid it with the knowledge that there is no structural pathology, and that this may all represent more functional pathology and somatization. He does understand aggressive physical therapy would be the treatment of choice, he is not interested at this juncture.  For insurance purposes [place] has failed over 6 weeks of conservative treatment including home physical therapy, medications, x-rays were unrevealing.  Chest pain, non-cardiac Derrall also presented to the ED with chest pain, troponins were cycled and were negative, he had outpatient cardiology follow-up that culminated in a normal treadmill stress test. He does not have risk factors such as diabetes, hyperlipidemia, hypertension or smoking. I explained him that the chest pain was likely noncardiac, there may be some somatization at play, no restrictions in activity at this juncture. Not interested in anxiety  treatment.    ____________________________________________ Joselyn Nicely. Sandy Crumb, M.D., ABFM., CAQSM., AME. Primary Care and Sports Medicine [facility]  Adjunct Professor of Family Medicine  [place] [employer]  Restaurant manager, fast food

## 2023-12-30 NOTE — Assessment & Plan Note (Signed)
 This is a pleasant previous healthy 33 year old male, we saw him back in March with vague numbness left side of the face without subsequent headache. He also endorses numbness going down the left arm, there was a history of potentially very mild cervical DDD, no visual changes, he was having increasing anxiety at the time. Neurologic exam at the last visit was normal, cranial nerves II through XII are intact, motor, sensory, coordination functions were all intact, he had no dysmetria, no dysdiadochokinesis. We did obtain cervical spine and brain MRIs with and without contrast, they were all normal, there was mild age appropriate noncompressive disc pathology in the cervical spine but this did not explain any paresthesias. These have improved to some degree.  He was then seen in the emergency department for chest pain, ultimately went to cardiology in an outpatient setting, treadmill stress test was negative.  I did learn that along the way he self discontinued his Cymbalta .  I am having a difficult time explaining these migratory symptoms, I do think there may be some somatization, we will go ahead and get upper and lower extremity nerve conduction and EMG to ensure that there is no compressive or neuropathic disease, I explained to him that if all of the above was negative there is no medical explanation for his symptoms and that they were likely related to anxiety.

## 2023-12-30 NOTE — Assessment & Plan Note (Signed)
 History is also has a history of chronic axial low back pain. He more recently endorsed a burning sensation through the medial right thigh, medial knee to the medial calf but not past. He has not had any bowel or bladder dysfunction, no overt saddle numbness, no constitutional symptoms. His hip exam is completely normal today, he has a negative internal straight leg raise. Lumbar spine x-rays in the past have been normal. He did recently self discontinue Cymbalta  but tells me he was only taking it for about 10 days. He has been having some other migratory paresthesias including facial and left arm numbness with normal brain and cervical spine advanced imaging. In addition, as above he had an episode of chest pain that led him to the ED, ultimately outpatient treadmill stress testing was normal, he has no risk factors for cerebrovascular disease. We will go ahead and proceed with lumbar spine MRI, I would also like upper and lower extremity nerve conduction and EMG, I explained him if all of the above were normal then his symptoms really had no medical explanation and that he should learn to avoid it with the knowledge that there is no structural pathology, and that this may all represent more functional pathology and somatization. He does understand aggressive physical therapy would be the treatment of choice, he is not interested at this juncture.  For insurance purposes Adam Adams has failed over 6 weeks of conservative treatment including home physical therapy, medications, x-rays were unrevealing.

## 2023-12-30 NOTE — Assessment & Plan Note (Signed)
 Adam Adams also presented to the ED with chest pain, troponins were cycled and were negative, he had outpatient cardiology follow-up that culminated in a normal treadmill stress test. He does not have risk factors such as diabetes, hyperlipidemia, hypertension or smoking. I explained him that the chest pain was likely noncardiac, there may be some somatization at play, no restrictions in activity at this juncture. Not interested in anxiety treatment.

## 2024-01-01 ENCOUNTER — Encounter: Payer: Self-pay | Admitting: Neurology

## 2024-01-02 ENCOUNTER — Other Ambulatory Visit: Payer: Self-pay

## 2024-01-02 DIAGNOSIS — R202 Paresthesia of skin: Secondary | ICD-10-CM

## 2024-01-19 DIAGNOSIS — M545 Low back pain, unspecified: Secondary | ICD-10-CM | POA: Insufficient documentation

## 2024-01-19 DIAGNOSIS — M25551 Pain in right hip: Secondary | ICD-10-CM | POA: Insufficient documentation

## 2024-01-22 ENCOUNTER — Ambulatory Visit: Payer: Self-pay | Admitting: Sports Medicine

## 2024-02-13 ENCOUNTER — Encounter: Admitting: Neurology

## 2024-05-04 ENCOUNTER — Encounter: Payer: Self-pay | Admitting: Sports Medicine

## 2024-08-02 ENCOUNTER — Ambulatory Visit
Admission: RE | Admit: 2024-08-02 | Discharge: 2024-08-02 | Disposition: A | Discharge status: Home / Self Care | Attending: Family Medicine | Admitting: Family Medicine

## 2024-08-02 ENCOUNTER — Other Ambulatory Visit: Payer: Self-pay

## 2024-08-02 VITALS — BP 139/89 | HR 82 | Temp 98.9°F | Resp 20 | Ht 68.0 in | Wt 230.0 lb

## 2024-08-02 DIAGNOSIS — R0602 Shortness of breath: Secondary | ICD-10-CM | POA: Diagnosis not present

## 2024-08-02 DIAGNOSIS — R6889 Other general symptoms and signs: Secondary | ICD-10-CM

## 2024-08-02 LAB — POCT INFLUENZA A/B
Influenza A, POC: NEGATIVE
Influenza B, POC: NEGATIVE

## 2024-08-02 LAB — POC SOFIA SARS ANTIGEN FIA: SARS Coronavirus 2 Ag: NEGATIVE

## 2024-08-02 MED ORDER — ALBUTEROL SULFATE HFA 108 (90 BASE) MCG/ACT IN AERS: 1.0000 | INHALATION_SPRAY | Freq: Four times a day (QID) | RESPIRATORY_TRACT | 0 refills | Status: AC | PRN

## 2024-08-02 MED ORDER — PREDNISONE 50 MG PO TABS: ORAL_TABLET | ORAL | 0 refills | Status: DC

## 2024-08-02 NOTE — Discharge Instructions (Signed)
 Drink lots of fluids Take prednisone  once a day for 5 days Continue albuterol every 4-6 hours as needed for shortness of breath Call for problems

## 2024-08-02 NOTE — ED Triage Notes (Signed)
 Pt presenting with non productive cough, runny nose, body aches, SOB and loss of voice, and tiredness  x 5 days. Pt stated he took Ibuprofen this AM for body aches which was ineffective.

## 2024-08-02 NOTE — ED Provider Notes (Signed)
 [facility]    CSN: [id number] Arrival date & time: 08/02/24  1845      History   Chief Complaint Chief Complaint  Patient presents with   Cough    Cough, difficulty breathing - Entered by patient    HPI Adam Adams is a 33 y.o. male.   Patient started feeling sick on Thursday.  This is his fourth full day of illness.  He has cough, congestion, body aches, fatigue, feeling of difficulty breathing.  He has used albuterol  with good results.  He states he feels like he has the flu.  No nausea or vomiting.  No fever or chills.    Past Medical History:  Diagnosis Date   Anxiety    Fatty liver    GERD (gastroesophageal reflux disease)    Joint pain    Male infertility 03/30/2015   Osteoarthritis    Right carpal tunnel syndrome 03/29/2013   Nerve conduction study shows right carpal tunnel syndrome without evidence of cervical radiculitis.    Vitamin D  deficiency     Patient Active Problem List   Diagnosis Date Noted   Left facial numbness 11/18/2023   DDD (degenerative disc disease), cervical 11/18/2023   Photosensitivity 09/10/2023   Skin rash 07/11/2023   Episodes of decreased attentiveness 07/11/2023   Pseudofolliculitis barbae 07/29/2022   Acute meniscal tear of left knee 10/30/2021   Biliary colic 05/23/2021   Foot trauma, right, initial encounter 02/22/2021   Chest pain, non-cardiac 12/11/2020   Insulin resistance 08/01/2020   Generalized anxiety disorder 08/01/2020   Hyperlipidemia 07/04/2020   Fibromyalgia 06/15/2020   Vitamin D  deficiency 04/25/2020   Pre-diabetes 04/25/2020   Chlamydial urethritis in male 03/14/2020   Class 1 obesity with serious comorbidity and body mass index (BMI) of 30.0 to 30.9 in adult 03/14/2020   Flexural eczema 10/25/2019   Irritant rhinitis 08/23/2019   Chronic left shoulder pain 08/23/2019   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 No pertinent surgical history.     Home Medications    Prior to Admission medications   Medication Sig Start Date End Date Taking? Authorizing Provider  albuterol  (VENTOLIN  HFA) 108 (90 Base) MCG/ACT inhaler Inhale 1-2 puffs into the lungs every 6 (six) hours as needed for wheezing or shortness of breath. 08/02/24  Yes Maranda Jamee Jacob, MD  lisdexamfetamine (VYVANSE) 20 MG capsule Take 20 mg by mouth every morning. 06/08/24  Yes [provider]  predniSONE  (DELTASONE ) 50 MG tablet Take once a day for 5 days.  Take with food 08/02/24  Yes Maranda Jamee Jacob, MD    Family History Family History  Problem Relation Age of Onset   Hypertension Mother    Hyperlipidemia Mother    Thyroid  disease Mother    Stroke Father    Liver disease Father    Alcoholism Father    Drug abuse Father    Obesity Father     Social History Social History   Tobacco Use   Smoking status: Former    Current packs/day: 0.00    Average packs/day: 1 pack/day for 0.5 years (0.5 ttl pk-yrs)    Types: Cigarettes    Start date: 03/04/2016  Quit date: 09/02/2016    Years since quitting: 7.9   Smokeless tobacco: Never  Substance Use Topics   Alcohol use: Yes    Comment: every other weekend   Drug use: No     Allergies   Patient has no known allergies.   Review of Systems Review of Systems  See HPI Physical Exam Triage Vital Signs ED Triage Vitals  Encounter Vitals Group     BP 08/02/24 1906 139/89     Girls Systolic BP Percentile --      Girls Diastolic BP Percentile --      Boys Systolic BP Percentile --      Boys Diastolic BP Percentile --      Pulse Rate 08/02/24 1906 82     Resp 08/02/24 1906 20     Temp 08/02/24 1906 98.9 F (37.2 C)     Temp Source  08/02/24 1906 Oral     SpO2 08/02/24 1906 96 %     Weight 08/02/24 1909 230 lb (104.3 kg)     Height 08/02/24 1909 5' 8 (1.727 m)     Head Circumference --      Peak Flow --      Pain Score 08/02/24 1909 0     Pain Loc --      Pain Education --      Exclude from Growth Chart --    No data found.  Updated Vital Signs BP 139/89 (BP Location: Right Arm)   Pulse 82   Temp 98.9 F (37.2 C) (Oral)   Resp 20   Ht 5' 8 (1.727 m)   Wt 104.3 kg   SpO2 96%   BMI 34.97 kg/m        Physical Exam Constitutional:      General: He is not in acute distress.    Appearance: He is well-developed.  HENT:     Head: Normocephalic and atraumatic.     Right Ear: Tympanic membrane normal.     Left Ear: Tympanic membrane normal.     Nose: Nose normal.     Mouth/Throat:     Mouth: Mucous membranes are moist.  Eyes:     Conjunctiva/sclera: Conjunctivae normal.     Pupils: Pupils are equal, round, and reactive to light.  Cardiovascular:     Rate and Rhythm: Normal rate and regular rhythm.     Heart sounds: Normal heart sounds.  Pulmonary:     Effort: Pulmonary effort is normal. No respiratory distress.     Breath sounds: Wheezing present.     Comments: Few scattered wheezes centrally Musculoskeletal:        General: Normal range of motion.     Cervical back: Normal range of motion.  Skin:    General: Skin is warm and dry.  Neurological:     Mental Status: He is alert.      UC Treatments / Results  Labs (all labs ordered are listed, but only abnormal results are displayed) Labs Reviewed  POCT INFLUENZA A/B - Normal  POC SOFIA SARS ANTIGEN FIA - Normal    EKG   Radiology No results found.  Procedures Procedures (including critical care time)  Medications Ordered in UC Medications - No data to display  Initial Impression / Assessment and Plan / UC Course  I have reviewed the triage vital signs and the nursing notes.  Pertinent labs & imaging results that were  available during my care of the patient were reviewed by me and considered  in my medical decision making (see chart for details).     COVID and flu are negative Viral illness discussed Final Clinical Impressions(s) / UC Diagnoses   Final diagnoses:  Flu-like symptoms  SOB (shortness of breath)     Discharge Instructions      Drink lots of fluids Take prednisone  once a day for 5 days Continue albuterol  every 4-6 hours as needed for shortness of breath Call for problems     ED Prescriptions     Medication Sig Dispense Auth. Provider   predniSONE  (DELTASONE ) 50 MG tablet Take once a day for 5 days.  Take with food 5 tablet Maranda Jamee Jacob, MD   albuterol  (VENTOLIN  HFA) 108 [phone] Base) MCG/ACT inhaler Inhale 1-2 puffs into the lungs every 6 (six) hours as needed for wheezing or shortness of breath. 18 g Maranda Jamee Jacob, MD      PDMP not reviewed this encounter.   Maranda Jamee Jacob, MD 08/02/24 [phone]

## 2024-08-04 ENCOUNTER — Ambulatory Visit (INDEPENDENT_AMBULATORY_CARE_PROVIDER_SITE_OTHER): Admitting: Family Medicine

## 2024-08-04 ENCOUNTER — Telehealth: Payer: Self-pay | Admitting: Family Medicine

## 2024-08-04 ENCOUNTER — Encounter: Payer: Self-pay | Admitting: Family Medicine

## 2024-08-04 ENCOUNTER — Ambulatory Visit: Payer: Self-pay

## 2024-08-04 VITALS — BP 121/85 | HR 74 | Temp 98.4°F | Ht 68.0 in | Wt 236.0 lb

## 2024-08-04 DIAGNOSIS — E785 Hyperlipidemia, unspecified: Secondary | ICD-10-CM | POA: Diagnosis not present

## 2024-08-04 DIAGNOSIS — J0100 Acute maxillary sinusitis, unspecified: Secondary | ICD-10-CM | POA: Diagnosis not present

## 2024-08-04 DIAGNOSIS — E559 Vitamin D deficiency, unspecified: Secondary | ICD-10-CM | POA: Diagnosis not present

## 2024-08-04 DIAGNOSIS — J329 Chronic sinusitis, unspecified: Secondary | ICD-10-CM | POA: Insufficient documentation

## 2024-08-04 DIAGNOSIS — R7303 Prediabetes: Secondary | ICD-10-CM | POA: Diagnosis not present

## 2024-08-04 MED ORDER — AMOXICILLIN-POT CLAVULANATE 875-125 MG PO TABS: 1.0000 | ORAL_TABLET | Freq: Two times a day (BID) | ORAL | 0 refills | Status: DC

## 2024-08-04 MED ORDER — BENZONATATE 200 MG PO CAPS: 200.0000 mg | ORAL_CAPSULE | Freq: Two times a day (BID) | ORAL | 0 refills | Status: DC | PRN

## 2024-08-04 NOTE — Telephone Encounter (Signed)
 FYI Only or Action Required?: FYI only for provider: appointment scheduled on 08/04/2024 at 9:10am with Dr Velma Ku.  Patient was last seen in primary care on 12/30/2023 by Curtis Debby PARAS, MD.  Called Nurse Triage reporting Cough.  Symptoms began a week ago.  Interventions attempted: OTC medications: tylenol , Prescription medications: prednisone , Rest, hydration, or home remedies, and Other: warm fluids.  Symptoms are: unchanged.  Triage Disposition: See Physician Within 24 Hours  Patient/caregiver understands and will follow disposition?: Yes            Copied from CRM #[id number]. Topic: Clinical - Red Word Triage >> Aug 04, 2024  7:51 AM Adam Adams wrote: Red Word that prompted transfer to Nurse Triage:  He has sore throat, coughing, headache, fevers at 99 or 100 that come and go. This has been going on for about 7 days and getting worse. Reason for Disposition  Earache  Answer Assessment - Initial Assessment Questions 5. FEVER: Do you have a fever? If Yes, ask: What is your temperature, how was it measured, and when did it start?     Low grade each night  7. FLU VACCINE: Did you get a flu shot this year?     No---usually does but hasn't due to government shutdown and usually gets it with [facility] [employer]  Answer Assessment - Initial Assessment Questions Patient was seen 08/02/2024 at Urgent Care --prescribed prednisone  and patient states he has an inhaler Flu/COVID tests were negative Patient Right ear pain but both are hurting Dry cough at night Productive cough during the day yellowish greenish mucous Patient does have some shortness of breath as well and states he has an inhaler Drainage from eyes--mostly on the left eye Hoarse Voice--gets better throughout the day and a low grade fever each night  Called [facility] to verify 9:10am appointment today was okay with it being a close time and was advised that was fine. Patient used to see Dr Curtis  and is scheduled with Dr Velma Ku today  Patient is advised to call us  back if anything changes or with any further questions/concerns. Patient is advised that if anything worsens to go to the Emergency Room. Patient verbalized understanding.  Protocols used: Influenza - Seasonal-A-AH, Cough - Acute Productive-A-AH

## 2024-08-04 NOTE — Progress Notes (Signed)
 Adam Adams - 33 y.o. male MRN [id number]  Date of birth: 1991/07/23  Subjective Chief Complaint  Patient presents with   URI   Cough   Fever    HPI Adam Adams is a 33 y.o. male here today with complaint of sinus pain and congestion, ear pain and pressure, eye drainage and productive cough.  He continues to have fever. Seen at [facility] and had testing for flu and COVID which was negative.  Prescribed prednisone  but really hasn't noticed much improvement.  Woke up this morning with conjunctivitis, thick yellow discharge from the eyes. Denies GI symptoms.  Little improvement with OTC treatments.    ROS:  A comprehensive ROS was completed and negative except as noted per HPI  No Known Allergies  Past Medical History:  Diagnosis Date   Anxiety    Fatty liver    GERD (gastroesophageal reflux disease)    Joint pain    Male infertility 03/30/2015   Osteoarthritis    Right carpal tunnel syndrome 03/29/2013   Nerve conduction study shows right carpal tunnel syndrome without evidence of cervical radiculitis.    Vitamin D  deficiency     No past surgical history on file.  Social History   Socioeconomic History   Marital status: Married    Spouse name: [name]    Number of children: Not on file   Years of education: Not on file   Highest education level: Not on file  Occupational History   Occupation: Transport Planner  Tobacco Use   Smoking status: Former    Current packs/day: 0.00    Average packs/day: 1 pack/day for 0.5 years (0.5 ttl pk-yrs)    Types: Cigarettes    Start date: 03/04/2016    Quit date: 09/02/2016    Years since quitting: 7.9   Smokeless tobacco: Never  Substance and Sexual Activity   Alcohol use: Yes    Comment: every other weekend   Drug use: No   Sexual activity: Not on file  Other Topics Concern   Not on file  Social History Narrative   Not on file   Social Drivers of Health   Financial Resource Strain: Low Risk  (11/30/2023)   Received  from [facility]   Overall Financial Resource Strain (CARDIA)    Difficulty of Paying Living Expenses: Not hard at all  Food Insecurity: No Food Insecurity (11/30/2023)   Received from [facility]   Hunger Vital Sign    Within the past 12 months, you worried that your food would run out before you got the money to buy more.: Never true    Within the past 12 months, the food you bought just didn't last and you didn't have money to get more.: Never true  Transportation Needs: No Transportation Needs (11/30/2023)   Received from [facility] - Transportation    Lack of Transportation (Medical): No    Lack of Transportation (Non-Medical): No  Physical Activity: Unknown (11/30/2023)   Received from [facility]   Exercise Vital Sign    On average, how many days per week do you engage in moderate to strenuous exercise (like a brisk walk)?: 0 days    Minutes of Exercise per Session: Not on file  Stress: No Stress Concern Present (11/30/2023)   Received from [facility] - Occupational Stress Questionnaire    Feeling of Stress : Only a little  Social Connections: Moderately Integrated (11/30/2023)   Received  from [facility]   Social Network    How would you rate your social network (family, work, friends)?: Adequate participation with social networks    Family History  Problem Relation Age of Onset   Hypertension Mother    Hyperlipidemia Mother    Thyroid  disease Mother    Stroke Father    Liver disease Father    Alcoholism Father    Drug abuse Father    Obesity Father     Health Maintenance  Topic Date Due   HPV VACCINES (1 - Risk 3-dose SCDM series) Never done   Hepatitis B Vaccines 19-59 Average Risk (3 of 3 - 19+ 3-dose series) 12/30/2017   COVID-19 Vaccine (5 - 2025-26 season) 05/03/2024   Influenza Vaccine  11/30/2024 (Originally 04/02/2024)   DTaP/Tdap/Td (3 - Td or Tdap) 04/06/2029   Hepatitis C Screening   Completed   Pneumococcal Vaccine  Aged Out   Meningococcal B Vaccine  Aged Out   HIV Screening  Discontinued     ----------------------------------------------------------------------------------------------------------------------------------------------------------------------------------------------------------------- Physical Exam BP 121/85 (BP Location: Left Arm, Patient Position: Sitting, Cuff Size: Large)   Pulse 74   Temp 98.4 F (36.9 C) (Oral)   Ht 5' 8 (1.727 m)   Wt 236 lb (107 kg)   SpO2 99%   BMI 35.88 kg/m   Physical Exam Constitutional:      Appearance: Normal appearance.  HENT:     Ears:     Comments: B/l TM erythema.      Mouth/Throat:     Mouth: Mucous membranes are moist.  Eyes:     General: No scleral icterus. Cardiovascular:     Rate and Rhythm: Normal rate and regular rhythm.  Pulmonary:     Effort: Pulmonary effort is normal.     Breath sounds: Normal breath sounds.  Lymphadenopathy:     Cervical: Cervical adenopathy present.  Neurological:     Mental Status: He is alert.  Psychiatric:        Mood and Affect: Mood normal.        Behavior: Behavior normal.     ------------------------------------------------------------------------------------------------------------------------------------------------------------------------------------------------------------------- Assessment and Plan  Sinusitis Sinusitis with OM.  Treating with augmentin.  Complete course of steroids.  Continue with supportive care.  Red flags reviewed.   Updating labs for chronic conditions today.    Meds ordered this encounter  Medications   amoxicillin-clavulanate (AUGMENTIN) 875-125 MG tablet    Sig: Take 1 tablet by mouth 2 (two) times daily.    Dispense:  20 tablet    Refill:  0   benzonatate  (TESSALON ) 200 MG capsule    Sig: Take 1 capsule (200 mg total) by mouth 2 (two) times daily as needed for cough.    Dispense:  20 capsule    Refill:  0    No  follow-ups on file.

## 2024-08-04 NOTE — Telephone Encounter (Signed)
 Called left vm for patient to call back and schedule appointment

## 2024-08-04 NOTE — Assessment & Plan Note (Signed)
 Sinusitis with OM.  Treating with augmentin.  Complete course of steroids.  Continue with supportive care.  Red flags reviewed.

## 2024-08-04 NOTE — Telephone Encounter (Signed)
 Patient is requesting to see you as his PCP

## 2024-08-05 LAB — CBC WITH DIFFERENTIAL/PLATELET
Basophils Absolute: 0.1 x10E3/uL (ref 0.0–0.2)
Basos: 1 %
EOS (ABSOLUTE): 0.1 x10E3/uL (ref 0.0–0.4)
Eos: 1 %
Hematocrit: 46.7 % (ref 37.5–51.0)
Hemoglobin: 15.3 g/dL (ref 13.0–17.7)
Immature Grans (Abs): 0.2 x10E3/uL — ABNORMAL HIGH (ref 0.0–0.1)
Immature Granulocytes: 1 %
Lymphocytes Absolute: 1.9 x10E3/uL (ref 0.7–3.1)
Lymphs: 14 %
MCH: 31.9 pg (ref 26.6–33.0)
MCHC: 32.8 g/dL (ref 31.5–35.7)
MCV: 97 fL (ref 79–97)
Monocytes Absolute: 0.6 x10E3/uL (ref 0.1–0.9)
Monocytes: 4 %
Neutrophils Absolute: 10.7 x10E3/uL — ABNORMAL HIGH (ref 1.4–7.0)
Neutrophils: 79 %
Platelets: 357 x10E3/uL (ref 150–450)
RBC: 4.8 x10E6/uL (ref 4.14–5.80)
RDW: 12.9 % (ref 11.6–15.4)
WBC: 13.5 x10E3/uL — ABNORMAL HIGH (ref 3.4–10.8)

## 2024-08-05 LAB — LIPID PANEL WITH LDL/HDL RATIO
Cholesterol, Total: 200 mg/dL — ABNORMAL HIGH (ref 100–199)
HDL: 62 mg/dL (ref >39)
LDL Chol Calc (NIH): 124 mg/dL — ABNORMAL HIGH (ref 0–99)
LDL/HDL Ratio: 2 ratio (ref 0.0–3.6)
Triglycerides: 80 mg/dL (ref 0–149)
VLDL Cholesterol Cal: 14 mg/dL (ref 5–40)

## 2024-08-05 LAB — CMP14+EGFR
ALT: 89 IU/L — ABNORMAL HIGH (ref 0–44)
AST: 41 IU/L — ABNORMAL HIGH (ref 0–40)
Albumin: 4.5 g/dL (ref 4.1–5.1)
Alkaline Phosphatase: 64 IU/L (ref 47–123)
BUN/Creatinine Ratio: 18 (ref 9–20)
BUN: 15 mg/dL (ref 6–20)
Bilirubin Total: 0.4 mg/dL (ref 0.0–1.2)
CO2: 24 mmol/L (ref 20–29)
Calcium: 9.7 mg/dL (ref 8.7–10.2)
Chloride: 104 mmol/L (ref 96–106)
Creatinine, Ser: 0.84 mg/dL (ref 0.76–1.27)
Globulin, Total: 2.7 g/dL (ref 1.5–4.5)
Glucose: 115 mg/dL — ABNORMAL HIGH (ref 70–99)
Potassium: 4.7 mmol/L (ref 3.5–5.2)
Sodium: 141 mmol/L (ref 134–144)
Total Protein: 7.2 g/dL (ref 6.0–8.5)
eGFR: 117 mL/min/1.73 (ref >59)

## 2024-08-05 LAB — VITAMIN D 25 HYDROXY (VIT D DEFICIENCY, FRACTURES): Vit D, 25-Hydroxy: 23.7 ng/mL — ABNORMAL LOW (ref 30.0–100.0)

## 2024-08-05 LAB — HEMOGLOBIN A1C
Est. average glucose Bld gHb Est-mCnc: 120 mg/dL
Hgb A1c MFr Bld: 5.8 % — ABNORMAL HIGH (ref 4.8–5.6)

## 2024-08-06 ENCOUNTER — Ambulatory Visit: Payer: Self-pay | Admitting: Family Medicine

## 2024-08-21 ENCOUNTER — Ambulatory Visit

## 2024-08-22 ENCOUNTER — Other Ambulatory Visit: Payer: Self-pay

## 2024-08-22 ENCOUNTER — Ambulatory Visit
Admission: RE | Admit: 2024-08-22 | Discharge: 2024-08-22 | Disposition: A | Discharge status: Home / Self Care | Source: Ambulatory Visit | Attending: Family Medicine | Admitting: Family Medicine

## 2024-08-22 VITALS — BP 134/86 | HR 68 | Temp 97.6°F | Resp 20 | Ht 68.0 in | Wt 230.0 lb

## 2024-08-22 DIAGNOSIS — J309 Allergic rhinitis, unspecified: Secondary | ICD-10-CM

## 2024-08-22 DIAGNOSIS — R059 Cough, unspecified: Secondary | ICD-10-CM | POA: Diagnosis not present

## 2024-08-22 DIAGNOSIS — J0100 Acute maxillary sinusitis, unspecified: Secondary | ICD-10-CM

## 2024-08-22 LAB — POCT RAPID STREP A (OFFICE): Rapid Strep A Screen: NEGATIVE

## 2024-08-22 MED ORDER — PREDNISONE 20 MG PO TABS: ORAL_TABLET | ORAL | 0 refills | Status: DC

## 2024-08-22 MED ORDER — DOXYCYCLINE HYCLATE 100 MG PO CAPS: 100.0000 mg | ORAL_CAPSULE | Freq: Two times a day (BID) | ORAL | 0 refills | Status: AC

## 2024-08-22 MED ORDER — FEXOFENADINE HCL 180 MG PO TABS: 180.0000 mg | ORAL_TABLET | Freq: Every day | ORAL | 0 refills | Status: DC

## 2024-08-22 NOTE — ED Provider Notes (Signed)
 " [facility]    CSN: [id number] Arrival date & time: 08/22/24  9173      History   Chief Complaint Chief Complaint  Patient presents with   Cough    Entered by patient    HPI Adam Adams is a 33 y.o. male.   HPI 33 year old male presents with cough 4 days.  PMH significant for obesity, fatty liver, and anxiety.  Patient is accompanied by his 64-year-old daughter who will also be evaluated by me.  Past Medical History:  Diagnosis Date   Anxiety    Fatty liver    GERD (gastroesophageal reflux disease)    Joint pain    Male infertility 03/30/2015   Osteoarthritis    Right carpal tunnel syndrome 03/29/2013   Nerve conduction study shows right carpal tunnel syndrome without evidence of cervical radiculitis.    Vitamin D  deficiency     Patient Active Problem List   Diagnosis Date Noted   Sinusitis 08/04/2024   Pain of right hip joint 01/19/2024   Low back pain 01/19/2024   Left facial numbness 11/18/2023   DDD (degenerative disc disease), cervical 11/18/2023   Photosensitivity 09/10/2023   Skin rash 07/11/2023   Episodes of decreased attentiveness 07/11/2023   Pseudofolliculitis barbae 07/29/2022   Acute meniscal tear of left knee 10/30/2021   Biliary colic 05/23/2021   Foot trauma, right, initial encounter 02/22/2021   Chest pain, non-cardiac 12/11/2020   Insulin resistance 08/01/2020   Generalized anxiety disorder 08/01/2020   Hyperlipidemia 07/04/2020   Fibromyalgia 06/15/2020   Vitamin D  deficiency 04/25/2020   Pre-diabetes 04/25/2020   Chlamydial urethritis in male 03/14/2020   Class 1 obesity with serious comorbidity and body mass index (BMI) of 30.0 to 30.9 in adult 03/14/2020   Flexural eczema 10/25/2019   Irritant rhinitis 08/23/2019   Chronic left shoulder pain 08/23/2019   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 No pertinent surgical history.     Home Medications    Prior to Admission medications  Medication Sig Start Date End Date Taking? Authorizing Provider  doxycycline  (VIBRAMYCIN ) 100 MG capsule Take 1 capsule (100 mg total) by mouth 2 (two) times daily for 7 days. 08/22/24 08/29/24 Yes Teddy Sharper, FNP  fexofenadine  (ALLEGRA  ALLERGY) 180 MG tablet Take 1 tablet (180 mg total) by mouth daily. 08/22/24 09/06/24 Yes Teddy Sharper, FNP  predniSONE  (DELTASONE ) 20 MG tablet Take 3 tabs PO daily x 5 days. 08/22/24  Yes Teddy Sharper, FNP  albuterol  (VENTOLIN  HFA) 108 (90 Base) MCG/ACT inhaler Inhale 1-2 puffs into the lungs every 6 (six) hours as needed for wheezing or shortness of breath. 08/02/24   Maranda Jamee Jacob, MD  lisdexamfetamine (VYVANSE) 20 MG capsule Take 20 mg by mouth every morning. 06/08/24   [provider]    Family History Family History  Problem Relation Age of Onset   Hypertension Mother    Hyperlipidemia Mother    Thyroid  disease Mother    Stroke Father    Liver disease Father    Alcoholism Father    Drug abuse Father    Obesity Father     Social History Social History[1]   Allergies   Patient has no  known allergies.   Review of Systems Review of Systems  Respiratory:  Positive for cough.   All other systems reviewed and are negative.    Physical Exam Triage Vital Signs ED Triage Vitals  Encounter Vitals Group     BP      Girls Systolic BP Percentile      Girls Diastolic BP Percentile      Boys Systolic BP Percentile      Boys Diastolic BP Percentile      Pulse      Resp      Temp      Temp src      SpO2      Weight      Height      Head Circumference      Peak Flow      Pain Score      Pain Loc      Pain Education      Exclude from Growth Chart     No data found.  Updated Vital Signs BP 134/86 (BP Location: Right Arm)   Pulse 68   Temp 97.6 F (36.4 C) (Oral)   Resp 20   Ht 5' 8 (1.727 m)   Wt 230 lb (104.3 kg)   SpO2 98%   BMI 34.97 kg/m    Physical Exam Vitals and nursing note reviewed.  Constitutional:      Appearance: Normal appearance. He is obese. He is ill-appearing.  HENT:     Head: Normocephalic and atraumatic.     Right Ear: Tympanic membrane and external ear normal.     Left Ear: Tympanic membrane and external ear normal.     Ears:     Comments: Moderate eustachian tube dysfunction noted bilaterally    Nose:     Comments: Turbinates are erythematous/edematous    Mouth/Throat:     Mouth: Mucous membranes are moist.     Pharynx: Oropharynx is clear.     Comments: Significant amount of clear drainage of posterior oropharynx noted Cardiovascular:     Rate and Rhythm: Normal rate and regular rhythm.     Heart sounds: Normal heart sounds.  Pulmonary:     Effort: Pulmonary effort is normal.     Breath sounds: Normal breath sounds. No wheezing, rhonchi or rales.     Comments: Frequent nonproductive cough on exam Musculoskeletal:        General: Normal range of motion.  Skin:    General: Skin is warm and dry.  Neurological:     General: No focal deficit present.     Mental Status: He is alert and oriented to person, place, and time. Mental status is at baseline.      UC Treatments / Results  Labs (all labs ordered are listed, but only abnormal results are displayed) Labs Reviewed  POCT RAPID STREP A (OFFICE) - Normal    EKG   Radiology No results found.  Procedures Procedures (including critical care time)  Medications Ordered in UC Medications - No data to display  Initial Impression / Assessment and Plan / UC Course  I have reviewed the triage vital signs and the nursing notes.  Pertinent labs & imaging results that were available during my care of the patient were reviewed by me  and considered in my medical decision making (see chart for details).     MDM: 1.  Acute nonrecurrent maxillary sinusitis-Rx'd doxycycline  100 mg capsule: Take 1 capsule twice daily x 7 days; 2.  Cough, unspecified  type-Rx'd prednisone  20 mg tablet: Take 3 tablets p.o. daily x 5 days; 3.  Allergic rhinitis, unspecified seasonality, unspecified trigger-Rx'd Allegra  180 mg tablet, take 1 tablet daily x 5 days, then as needed. Advised patient take medications as directed with food to completion.  Advised take prednisone  and Allegra  with first dose of doxycycline  for the next 5 of 7 days.  Advised may use Allegra  as needed afterwards for concurrent postnasal drainage/drip/runny nose.  Encouraged increase daily water intake to 64 ounces per day while taking these medications.  Advised if symptoms worsen and/or unresolved please follow-up with your PCP or here for further evaluation.  Patient discharged home, hemodynamically stable. Final Clinical Impressions(s) / UC Diagnoses   Final diagnoses:  Acute non-recurrent maxillary sinusitis  Cough, unspecified type  Allergic rhinitis, unspecified seasonality, unspecified trigger     Discharge Instructions      Advised patient take medications as directed with food to completion.  Advised take prednisone  and Allegra  with first dose of doxycycline  for the next 5 of 7 days.  Advised may use Allegra  as needed afterwards for concurrent postnasal drainage/drip/runny nose.  Encouraged increase daily water intake to 64 ounces per day while taking these medications.  Advised if symptoms worsen and/or unresolved please follow-up with your PCP or here for further evaluation.     ED Prescriptions     Medication Sig Dispense Auth. Provider   doxycycline  (VIBRAMYCIN ) 100 MG capsule Take 1 capsule (100 mg total) by mouth 2 (two) times daily for 7 days. 14 capsule Loray Akard, FNP   predniSONE  (DELTASONE ) 20 MG tablet Take 3 tabs PO daily x 5 days. 15 tablet  Diamante Rubin, FNP   fexofenadine  (ALLEGRA  ALLERGY) 180 MG tablet Take 1 tablet (180 mg total) by mouth daily. 15 tablet Linetta Regner, FNP      PDMP not reviewed this encounter.    [1]  Social History Tobacco Use   Smoking status: Former    Current packs/day: 0.00    Average packs/day: 1 pack/day for 0.5 years (0.5 ttl pk-yrs)    Types: Cigarettes    Start date: 03/04/2016    Quit date: 09/02/2016    Years since quitting: 7.9   Smokeless tobacco: Never  Substance Use Topics   Alcohol use: Yes    Comment: every other weekend   Drug use: No     Teddy Sharper, FNP 08/22/24 1000  "
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
## 2024-08-22 NOTE — Discharge Instructions (Addendum)
 Advised patient take medications as directed with food to completion.  Advised take prednisone  and Allegra  with first dose of doxycycline  for the next 5 of 7 days.  Advised may use Allegra  as needed afterwards for concurrent postnasal drainage/drip/runny nose.  Encouraged increase daily water intake to 64 ounces per day while taking these medications.  Advised if symptoms worsen and/or unresolved please follow-up with your PCP or here for further evaluation.

## 2024-08-23 ENCOUNTER — Telehealth: Payer: Self-pay

## 2024-08-23 NOTE — Telephone Encounter (Signed)
Called to check on patient. No answer. Left voicemail.

## 2024-09-06 ENCOUNTER — Ambulatory Visit
Admission: EM | Admit: 2024-09-06 | Discharge: 2024-09-06 | Disposition: A | Discharge status: Home / Self Care | Attending: Family Medicine | Admitting: Family Medicine

## 2024-09-06 DIAGNOSIS — R6889 Other general symptoms and signs: Secondary | ICD-10-CM

## 2024-09-06 LAB — POCT INFLUENZA A/B
Influenza A, POC: NEGATIVE
Influenza B, POC: NEGATIVE

## 2024-09-06 NOTE — ED Triage Notes (Signed)
 Pt states that he has a fever, sob, body aches, coughing and chest congestion. X3 days  Pt states that he also has had some nausea and dizziness.  Pt denies taking any otc medications.

## 2024-09-06 NOTE — ED Provider Notes (Signed)
 " [name]    CSN: [id number] Arrival date & time: 09/06/24  0903      History   Chief Complaint Chief Complaint  Patient presents with   Fever    HPI Eliel Bilyeu is a 34 y.o. male.   Patient states that he seems to have a lot of respiratory infections this winter.  He was just seen on 08/22/2024 and thought to have sinus infection.  He was treated with antibiotics and got better.  He states he was well for a week.  Now he has high fever, headache, body aches, chills, fatigue and some sore throat since yesterday.  Nausea.  Feels like he might have flu    Past Medical History:  Diagnosis Date   Anxiety    Fatty liver    GERD (gastroesophageal reflux disease)    Joint pain    Male infertility 03/30/2015   Osteoarthritis    Right carpal tunnel syndrome 03/29/2013   Nerve conduction study shows right carpal tunnel syndrome without evidence of cervical radiculitis.    Vitamin D  deficiency     Patient Active Problem List   Diagnosis Date Noted   Sinusitis 08/04/2024   Pain of right hip joint 01/19/2024   Low back pain 01/19/2024   Left facial numbness 11/18/2023   DDD (degenerative disc disease), cervical 11/18/2023   Photosensitivity 09/10/2023   Skin rash 07/11/2023   Episodes of decreased attentiveness 07/11/2023   Pseudofolliculitis barbae 07/29/2022   Acute meniscal tear of left knee 10/30/2021   Biliary colic 05/23/2021   Foot trauma, right, initial encounter 02/22/2021   Chest pain, non-cardiac 12/11/2020   Insulin resistance 08/01/2020   Generalized anxiety disorder 08/01/2020   Hyperlipidemia 07/04/2020   Fibromyalgia 06/15/2020   Vitamin D  deficiency 04/25/2020   Pre-diabetes 04/25/2020   Chlamydial urethritis in male 03/14/2020   Class 1 obesity with serious comorbidity and body mass index (BMI) of 30.0 to 30.9 in adult 03/14/2020   Flexural eczema 10/25/2019   Irritant rhinitis 08/23/2019   Chronic left shoulder pain 08/23/2019    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 No pertinent surgical history.     Home Medications    Prior to Admission medications  Medication Sig Start Date End Date Taking? Authorizing Provider  lisdexamfetamine (VYVANSE) 20 MG capsule Take 20 mg by mouth every morning. 06/08/24  Yes [provider]  albuterol  (VENTOLIN  HFA) 108 (90 Base) MCG/ACT inhaler Inhale 1-2 puffs into the lungs every 6 (six) hours as needed for wheezing or shortness of breath. 08/02/24   Maranda Jamee Jacob, MD    Family History Family History  Problem Relation Age of Onset   Hypertension Mother    Hyperlipidemia Mother    Thyroid  disease Mother    Stroke Father    Liver disease Father    Alcoholism Father    Drug abuse Father    Obesity Father     Social History Social History[1]   Allergies   Patient has no known allergies.   Review of Systems Review of Systems See HPI  Physical Exam Triage Vital Signs ED Triage Vitals  Encounter Vitals Group     BP 09/06/24 0930 138/72  Girls Systolic BP Percentile --      Girls Diastolic BP Percentile --      Boys Systolic BP Percentile --      Boys Diastolic BP Percentile --      Pulse Rate 09/06/24 0930 78     Resp 09/06/24 0930 19     Temp 09/06/24 0930 98.5 F (36.9 C)     Temp Source 09/06/24 0930 Oral     SpO2 09/06/24 0930 97 %     Weight 09/06/24 0929 230 lb (104.3 kg)     Height 09/06/24 0929 5' 8 (1.727 m)     Head Circumference --      Peak Flow --      Pain Score 09/06/24 0928 8     Pain Loc --      Pain Education --      Exclude from Growth Chart --    No data found.  Updated Vital Signs BP 138/72 (BP Location:  Right Arm)   Pulse 78   Temp 98.5 F (36.9 C) (Oral)   Resp 19   Ht 5' 8 (1.727 m)   Wt 104.3 kg   SpO2 97%   BMI 34.97 kg/m      Physical Exam Constitutional:      General: He is not in acute distress.    Appearance: He is well-developed.     Comments: No acute distress  HENT:     Head: Normocephalic and atraumatic.     Right Ear: Tympanic membrane normal.     Left Ear: Tympanic membrane normal.     Nose: Nose normal.     Mouth/Throat:     Mouth: Mucous membranes are moist.     Pharynx: Posterior oropharyngeal erythema present.  Eyes:     Conjunctiva/sclera: Conjunctivae normal.     Pupils: Pupils are equal, round, and reactive to light.  Cardiovascular:     Rate and Rhythm: Normal rate and regular rhythm.     Heart sounds: Normal heart sounds.  Pulmonary:     Effort: Pulmonary effort is normal. No respiratory distress.     Breath sounds: Normal breath sounds.  Musculoskeletal:        General: Normal range of motion.     Cervical back: Normal range of motion.  Lymphadenopathy:     Cervical: No cervical adenopathy.  Skin:    General: Skin is warm and dry.  Neurological:     Mental Status: He is alert.      UC Treatments / Results  Labs (all labs ordered are listed, but only abnormal results are displayed) Labs Reviewed  POCT INFLUENZA A/B - Normal    EKG   Radiology No results found.  Procedures Procedures (including critical care time)  Medications Ordered in UC Medications - No data to display  Initial Impression / Assessment and Plan / UC Course  I have reviewed the triage vital signs and the nursing notes.  Pertinent labs & imaging results that were available during my care of the patient were reviewed by me and considered in my medical decision making (see chart for details).      Final Clinical Impressions(s) / UC Diagnoses   Final diagnoses:  Flu-like symptoms     Discharge Instructions      Make sure you are drinking lots of  fluids May take Tylenol  or ibuprofen for pain and fever May use over-the-counter cough or cold medicines as needed See your doctor if not improving towards the end  of the week     ED Prescriptions   None    PDMP not reviewed this encounter.     [1]  Social History Tobacco Use   Smoking status: Former    Current packs/day: 0.00    Average packs/day: 1 pack/day for 0.5 years (0.5 ttl pk-yrs)    Types: Cigarettes    Start date: 03/04/2016    Quit date: 09/02/2016    Years since quitting: 8.0   Smokeless tobacco: Never  Vaping Use   Vaping status: Never Used  Substance Use Topics   Alcohol use: Yes    Comment: every other weekend   Drug use: No     Maranda Jamee Jacob, MD 09/06/24 1035  "

## 2024-09-06 NOTE — Discharge Instructions (Addendum)
 Make sure you are drinking lots of fluids May take Tylenol  or ibuprofen for pain and fever May use over-the-counter cough or cold medicines as needed See your doctor if not improving towards the end of the week
